# Patient Record
Sex: Male | Born: 1955 | Race: White | Hispanic: No | Marital: Married | Smoking: Former smoker
Health system: Southern US, Community
[De-identification: ages and names within clinical notes are randomized; demographics above are authoritative.]

## PROBLEM LIST (undated history)

## (undated) DIAGNOSIS — K642 Third degree hemorrhoids: Secondary | ICD-10-CM

## (undated) DIAGNOSIS — K648 Other hemorrhoids: Secondary | ICD-10-CM

## (undated) HISTORY — PX: COLONOSCOPY: SHX174

## (undated) HISTORY — DX: Other hemorrhoids: K64.8

## (undated) HISTORY — PX: TONSILLECTOMY: SUR1361

## (undated) HISTORY — DX: Third degree hemorrhoids: K64.2

## (undated) HISTORY — PX: INGUINAL HERNIA REPAIR: SUR1180

---

## 2005-06-08 ENCOUNTER — Inpatient Hospital Stay: Payer: Self-pay | Admitting: Podiatry

## 2006-05-17 IMAGING — CR RIGHT FOOT COMPLETE - 3+ VIEW
1 series · 3 of 3 positions shown · non-contrast
Comparison: none

REASON FOR EXAM: evaluation of osteomyelitis in the bone
COMMENTS:

[Series 1: view not recorded · 0.17mm/px · 3 of 3 slices shown]
[im 1/3]
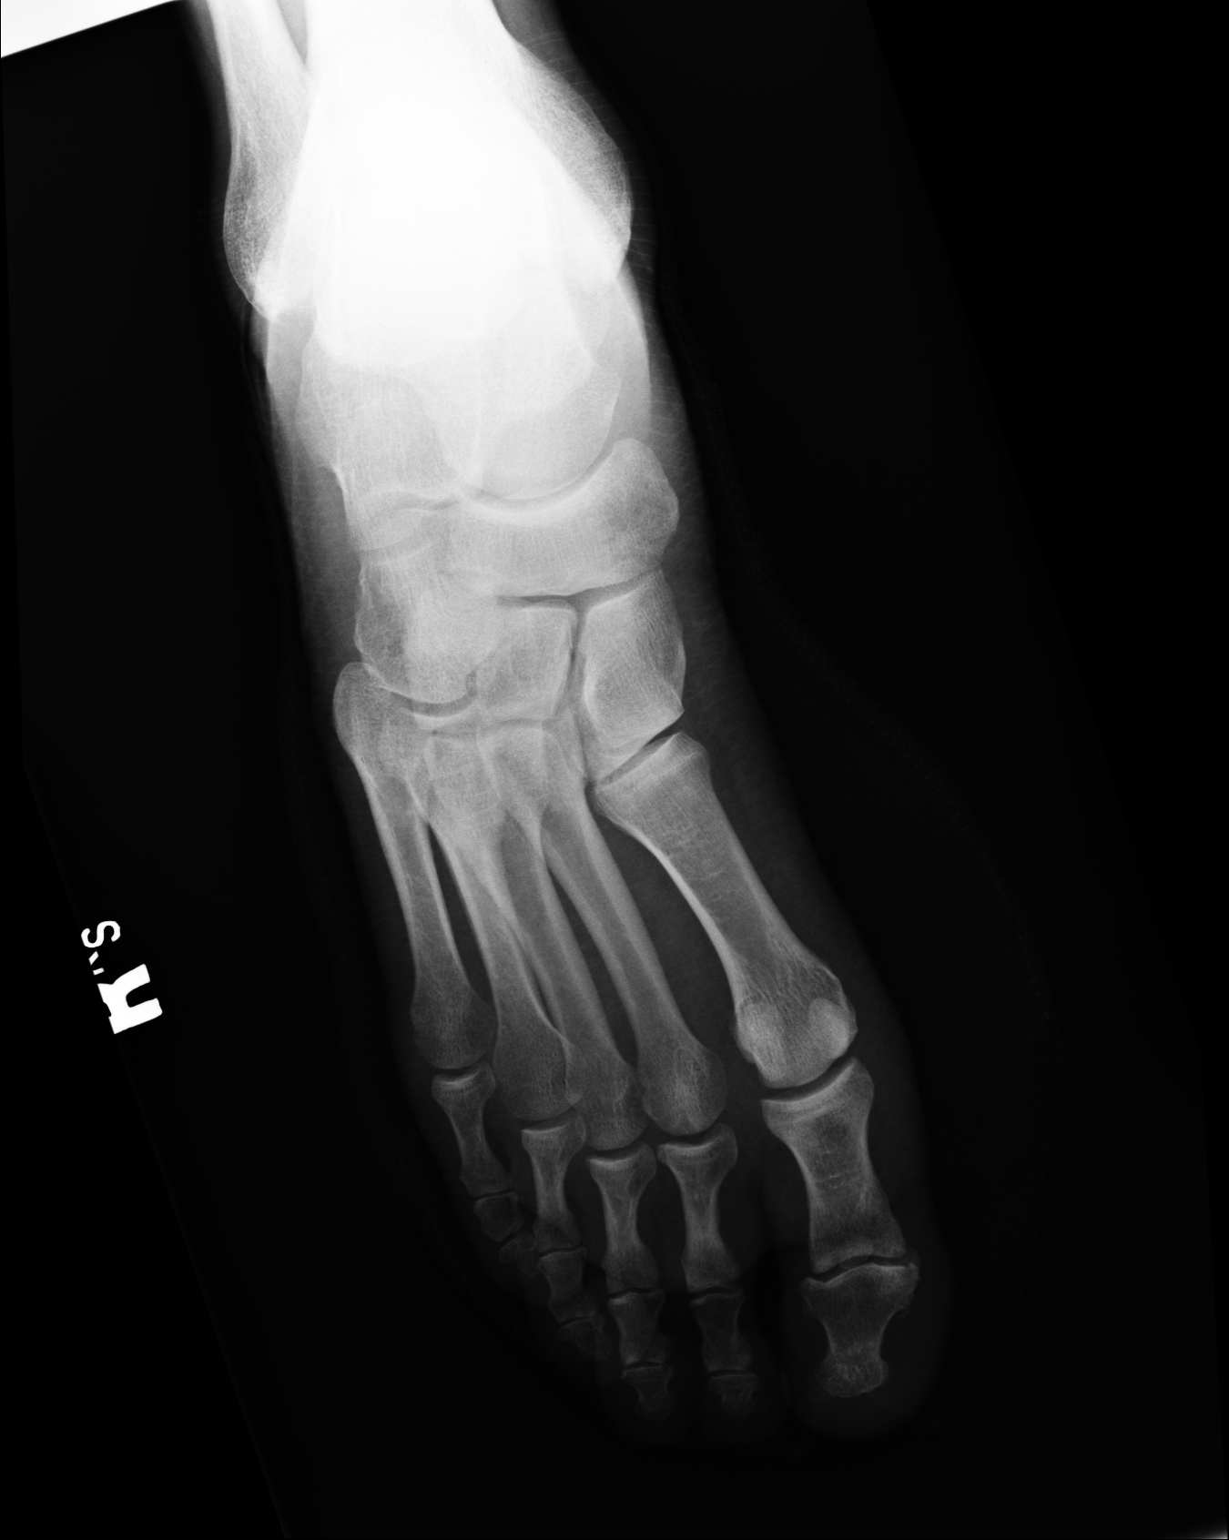
[im 2/3]
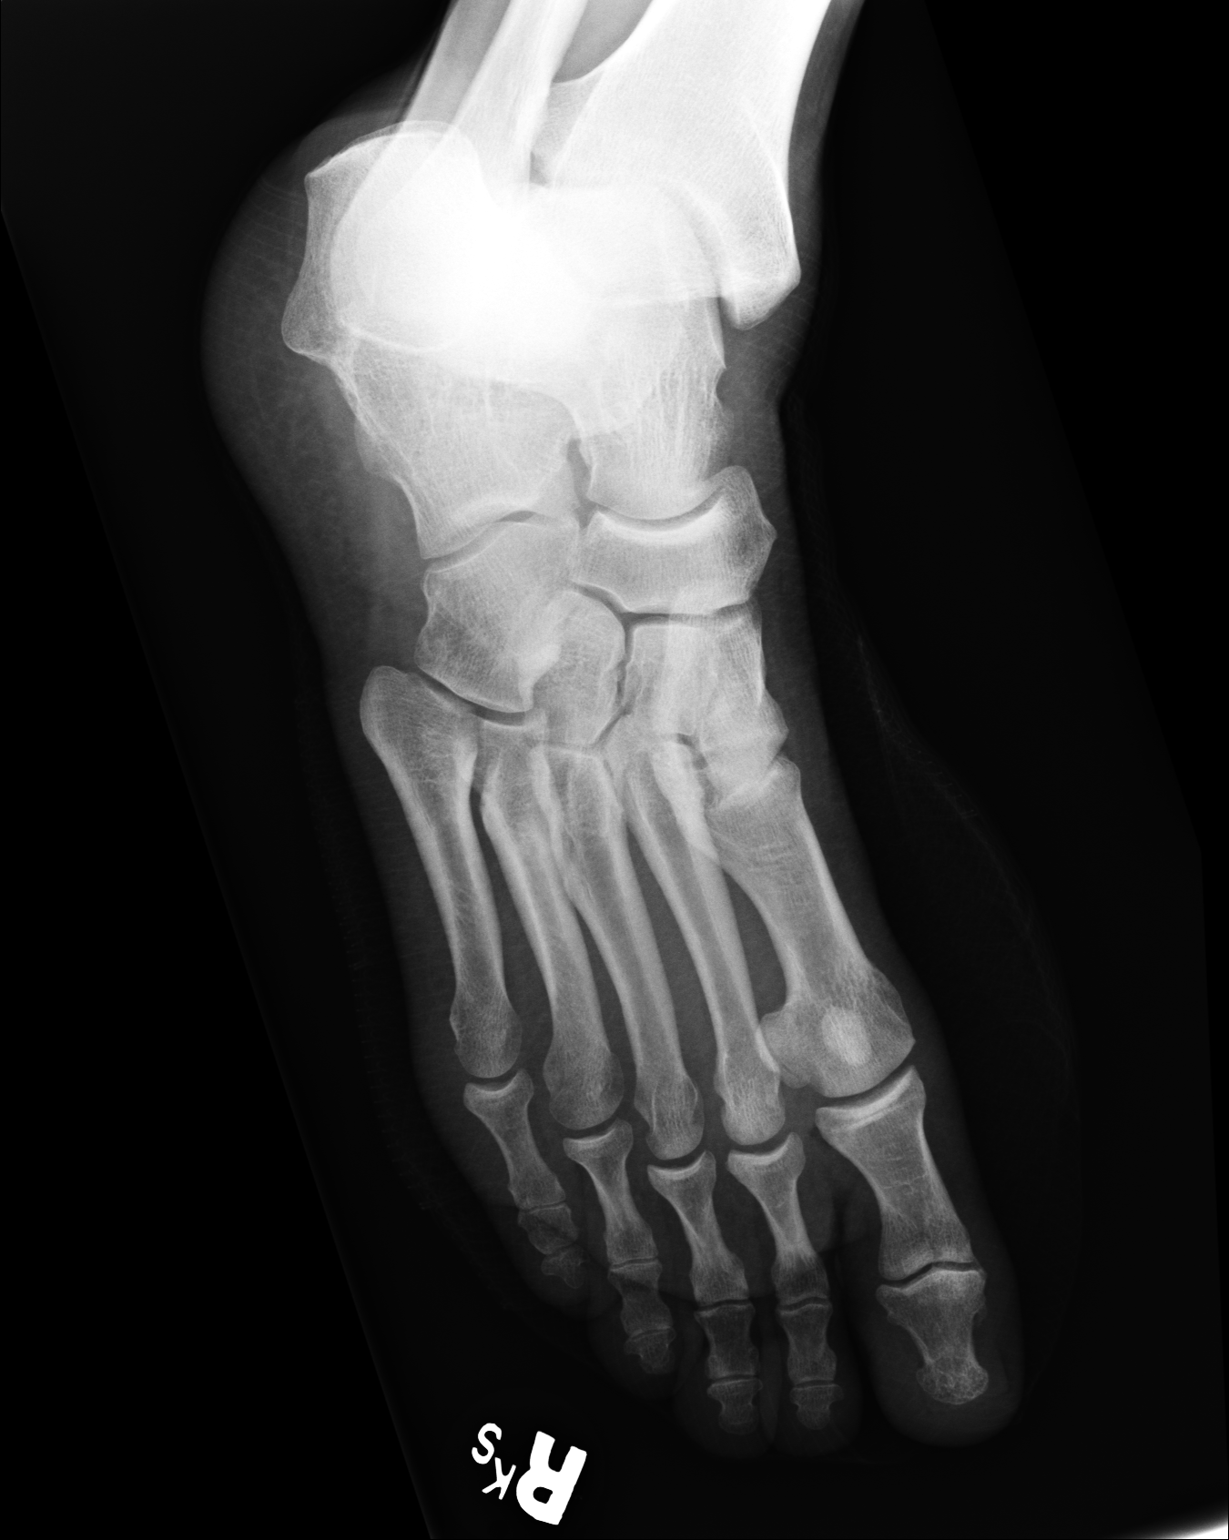
[im 3/3]
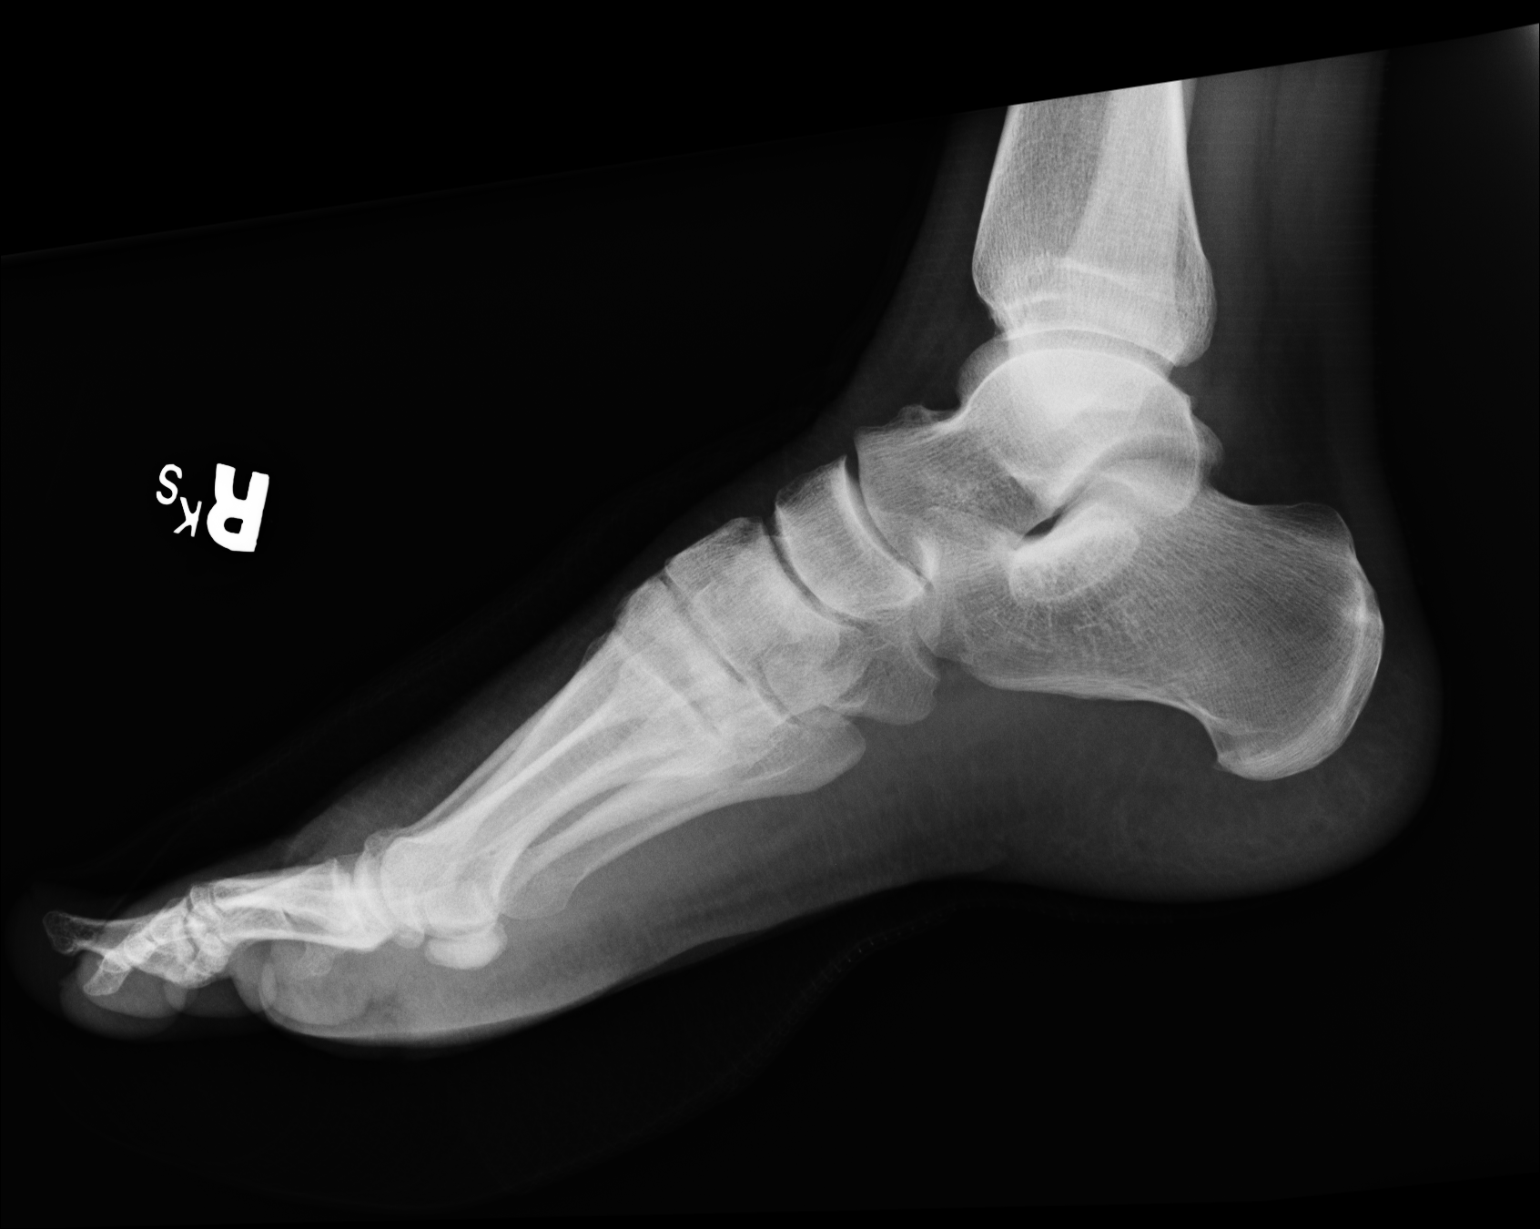

[3 of 3 positions shown; findings below may reference images not displayed]

PROCEDURE:     DXR - DXR FOOT RT COMPLETE W/OBLIQUES  - June 12, 2005  [DATE]

RESULT:     Three views of the RIGHT foot are obtained. There appears to be
some irregularity over the soft tissues of the ball of the foot on the
lateral view.  A bandage is present on the dorsoplantar view medially.
There does not appear to be evidence of bony resorption or rarefaction.  No
definite fracture is seen. No radiopaque foreign body is evident.
IMPRESSION:

## 2006-12-19 HISTORY — PX: OTHER SURGICAL HISTORY: SHX169

## 2007-03-18 ENCOUNTER — Ambulatory Visit: Payer: Self-pay | Admitting: Otolaryngology

## 2007-03-21 ENCOUNTER — Ambulatory Visit: Payer: Self-pay | Admitting: Otolaryngology

## 2007-04-17 ENCOUNTER — Ambulatory Visit: Payer: Self-pay | Admitting: Otolaryngology

## 2008-12-08 ENCOUNTER — Ambulatory Visit: Payer: Self-pay | Admitting: Gastroenterology

## 2013-07-12 ENCOUNTER — Ambulatory Visit: Payer: Self-pay | Admitting: Otolaryngology

## 2014-06-16 IMAGING — CT CT NECK WITH CONTRAST
2 series · 10 of 14 positions shown, 12 images · IV contrast (agent unspecified)
Comparison: none

REASON FOR EXAM: neck mass
COMMENTS:

PROCEDURE:     KCT - KCT NECK WITH CONTRAST  - July 12, 2013 [DATE]
RESULT:
TECHNIQUE: Helical 3 mm sections were obtained from the skull base through
the thoracic inlet status post intravenous administration of 75 mL of
Ysovue-D84.

[Series 2: neck 3.0 3 · axial · 0.46mm/px · z∈[-280,-13]mm · 8 of 115 slices shown, 10 images]
[im 13/115  soft-tissue]
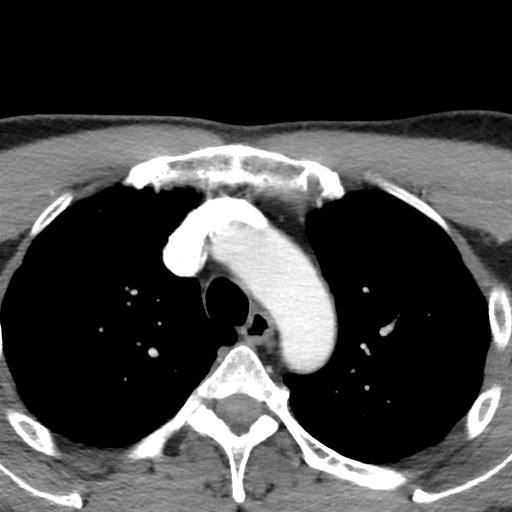
[im 13/115  bone]
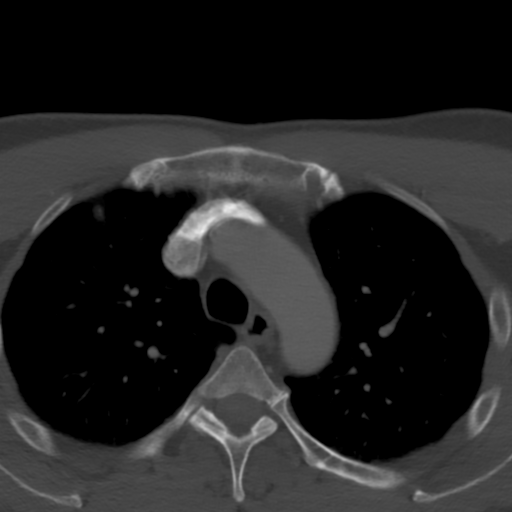
[im 26/115  bone]
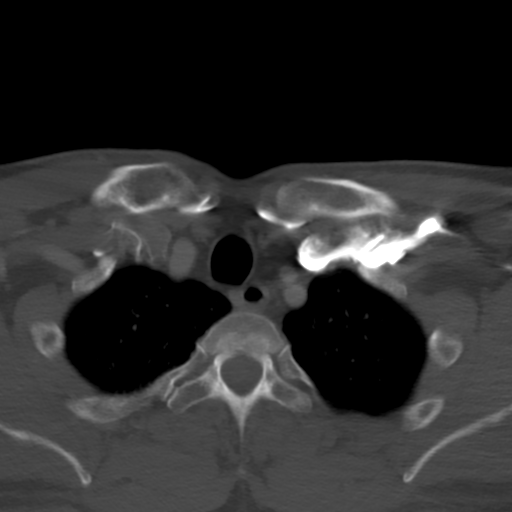
[im 39/115  bone]
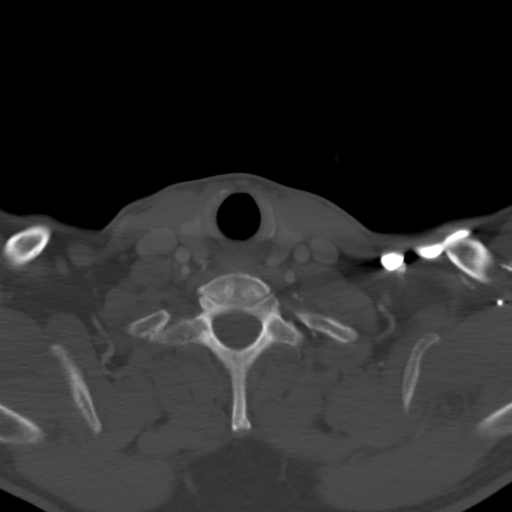
[im 51/115  bone]
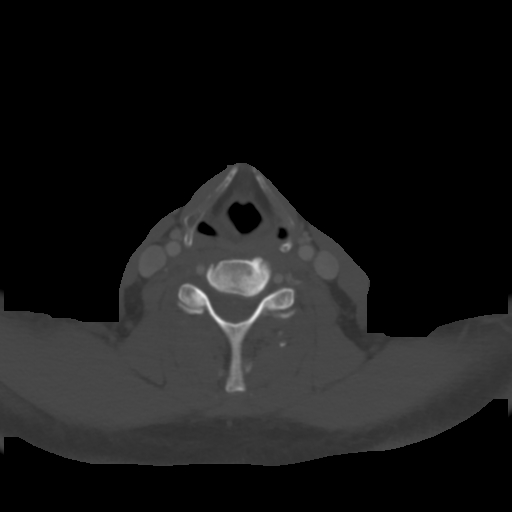
[im 64/115  soft-tissue]
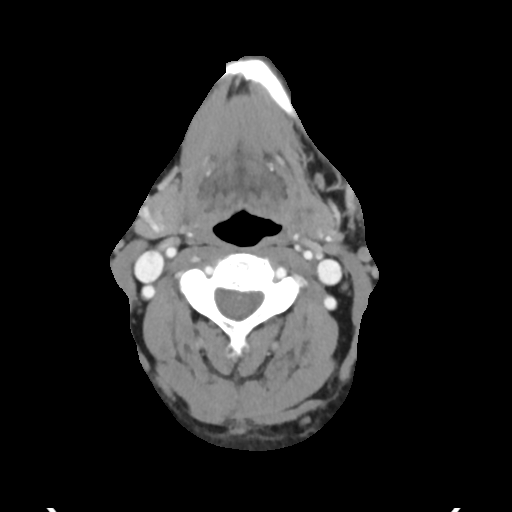
[im 64/115  bone]
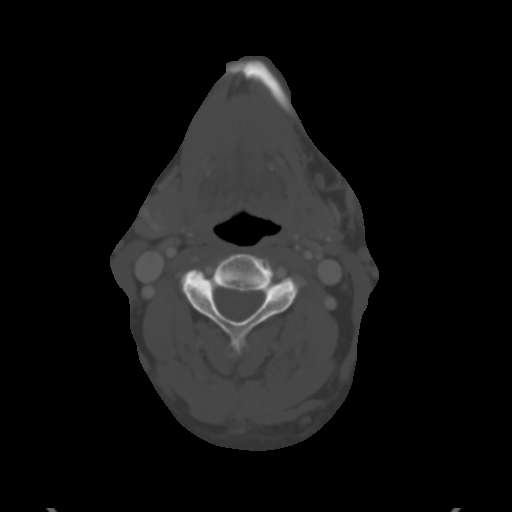
[im 77/115  bone]
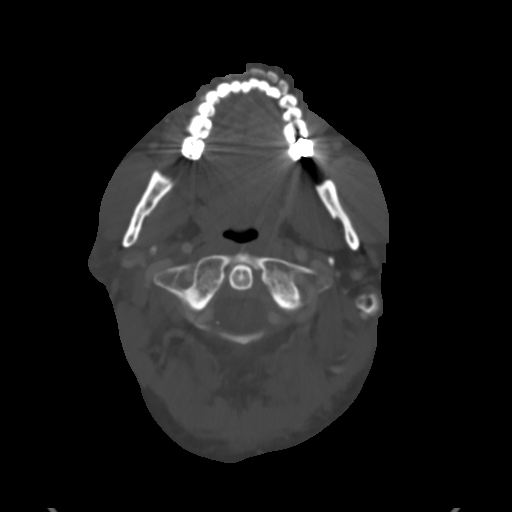
[im 89/115  bone]
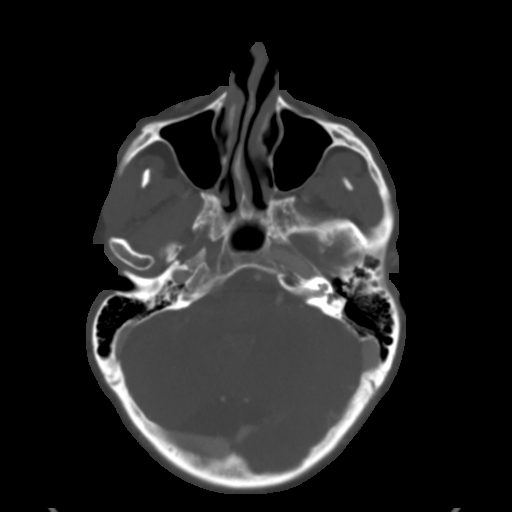
[im 102/115  bone]
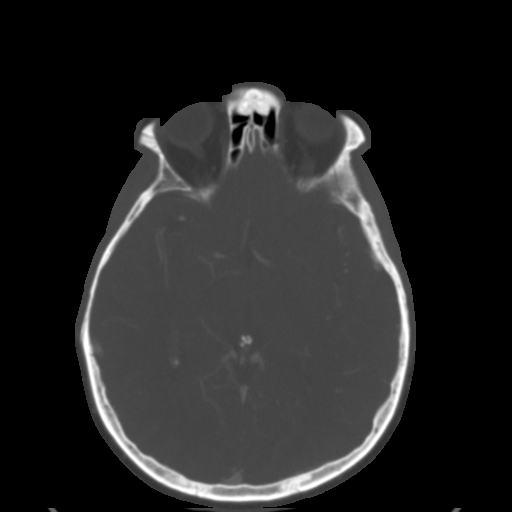

[Series 4: lung · axial · 0.62mm/px · z∈[-274,-232]mm · 2 of 44 slices shown]
[im 15/44  bone]
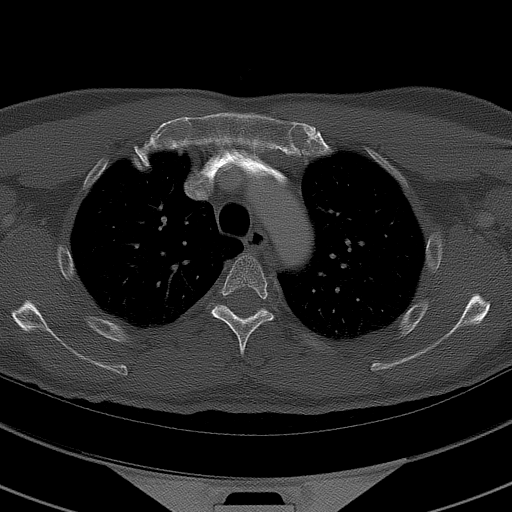
[im 29/44  bone]
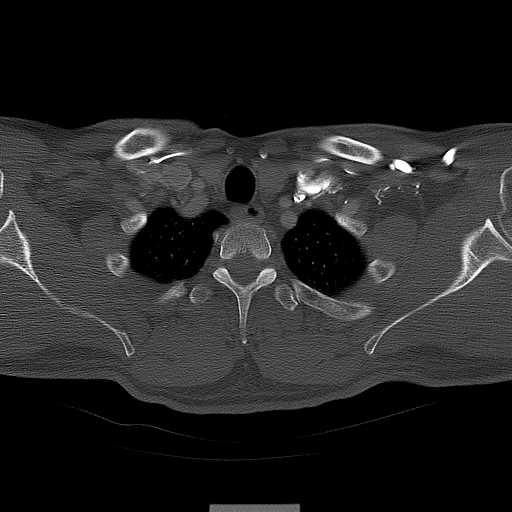

[10 of 14 positions shown; findings below may reference images not displayed]

FINDINGS: The skull base is unremarkable.

The pharyngeal and glottic regions are unremarkable. The airway is patent.
The spaces of the neck appear maintained. The opacified vascular structures
are unremarkable. Prominent lymph nodes are identified within the carotid
space region on the right the largest measuring 9.2 millimeters in diameter
in short axis and on the left 8.2 mm. There is otherwise no evidence of
masses, free fluid or loculated fluid collections. In the region of concern
along the anterior neck on the right a small subcutaneous lymph node is
appreciated measuring approximately 6.3 mm in diameter. Subcutaneous fat is
otherwise unremarkable. No further masses or adenopathy is identified.

The salivary glands are symmetric. The left parotid gland is not clearly
identified consistent with the patient's history of resection. The right
demonstrates no attenuation nor enhancement abnormalities. The musculature
of the neck is symmetric without abnormalities. The lung apices demonstrate
subpleural bullous changes posteriorly.
IMPRESSION: 1. No definite evidence of appreciable mass within the neck in the region of
palpable concern. A small lymph node is identified.
2. Prominent lymph nodes within the carotid space.
3. Postsurgical changes in the parotid bed on the left.

## 2017-10-09 ENCOUNTER — Observation Stay
Admission: EM | Admit: 2017-10-09 | Discharge: 2017-10-09 | Disposition: A | Discharge status: Home / Self Care | Payer: 59 | Attending: General Surgery | Admitting: General Surgery

## 2017-10-09 ENCOUNTER — Encounter: Payer: Self-pay | Admitting: *Deleted

## 2017-10-09 DIAGNOSIS — K644 Residual hemorrhoidal skin tags: Secondary | ICD-10-CM | POA: Diagnosis not present

## 2017-10-09 DIAGNOSIS — K642 Third degree hemorrhoids: Secondary | ICD-10-CM

## 2017-10-09 DIAGNOSIS — Z880 Allergy status to penicillin: Secondary | ICD-10-CM | POA: Insufficient documentation

## 2017-10-09 DIAGNOSIS — K648 Other hemorrhoids: Secondary | ICD-10-CM | POA: Diagnosis not present

## 2017-10-09 DIAGNOSIS — Z79899 Other long term (current) drug therapy: Secondary | ICD-10-CM | POA: Insufficient documentation

## 2017-10-09 HISTORY — DX: Third degree hemorrhoids: K64.2

## 2017-10-09 LAB — CBC WITH DIFFERENTIAL/PLATELET
Basophils Absolute: 0.1 10*3/uL (ref 0–0.1)
Basophils Relative: 1 %
Eosinophils Absolute: 0.1 10*3/uL (ref 0–0.7)
Eosinophils Relative: 1 %
HEMATOCRIT: 48.3 % (ref 40.0–52.0)
HEMOGLOBIN: 16.5 g/dL (ref 13.0–18.0)
LYMPHS ABS: 1.6 10*3/uL (ref 1.0–3.6)
LYMPHS PCT: 14 %
MCH: 30.2 pg (ref 26.0–34.0)
MCHC: 34.1 g/dL (ref 32.0–36.0)
MCV: 88.5 fL (ref 80.0–100.0)
MONOS PCT: 8 %
Monocytes Absolute: 0.9 10*3/uL (ref 0.2–1.0)
NEUTROS ABS: 8.5 10*3/uL — AB (ref 1.4–6.5)
NEUTROS PCT: 76 %
Platelets: 207 10*3/uL (ref 150–440)
RBC: 5.46 MIL/uL (ref 4.40–5.90)
RDW: 13.1 % (ref 11.5–14.5)
WBC: 11.1 10*3/uL — ABNORMAL HIGH (ref 3.8–10.6)

## 2017-10-09 LAB — URINALYSIS, ROUTINE W REFLEX MICROSCOPIC
Bilirubin Urine: NEGATIVE
Glucose, UA: NEGATIVE mg/dL
HGB URINE DIPSTICK: NEGATIVE
Ketones, ur: 5 mg/dL — AB
Leukocytes, UA: NEGATIVE
Nitrite: NEGATIVE
PH: 5 (ref 5.0–8.0)
Protein, ur: NEGATIVE mg/dL
SPECIFIC GRAVITY, URINE: 1.027 (ref 1.005–1.030)

## 2017-10-09 LAB — COMPREHENSIVE METABOLIC PANEL
ALBUMIN: 4.4 g/dL (ref 3.5–5.0)
ALK PHOS: 71 U/L (ref 38–126)
ALT: 20 U/L (ref 17–63)
ANION GAP: 8 (ref 5–15)
AST: 30 U/L (ref 15–41)
BUN: 29 mg/dL — ABNORMAL HIGH (ref 6–20)
CALCIUM: 9.6 mg/dL (ref 8.9–10.3)
CO2: 24 mmol/L (ref 22–32)
Chloride: 103 mmol/L (ref 101–111)
Creatinine, Ser: 0.92 mg/dL (ref 0.61–1.24)
GFR calc Af Amer: >60 mL/min (ref >60)
GFR calc non Af Amer: >60 mL/min (ref >60)
GLUCOSE: 116 mg/dL — AB (ref 65–99)
Potassium: 4 mmol/L (ref 3.5–5.1)
SODIUM: 135 mmol/L (ref 135–145)
Total Bilirubin: 0.6 mg/dL (ref 0.3–1.2)
Total Protein: 7.7 g/dL (ref 6.5–8.1)

## 2017-10-09 LAB — LIPASE, BLOOD: Lipase: 32 U/L (ref 11–51)

## 2017-10-09 MED ORDER — OXYCODONE-ACETAMINOPHEN 5-325 MG PO TABS: 1.0000 | ORAL_TABLET | Freq: Four times a day (QID) | ORAL | Status: DC | PRN

## 2017-10-09 MED ORDER — ACETAMINOPHEN 650 MG RE SUPP: 650.0000 mg | Freq: Four times a day (QID) | RECTAL | Status: DC | PRN

## 2017-10-09 MED ORDER — DOCUSATE SODIUM 100 MG PO CAPS: 100.0000 mg | ORAL_CAPSULE | Freq: Every day | ORAL | 0 refills | Status: AC | PRN

## 2017-10-09 MED ORDER — HYDROCORTISONE 2.5 % RE CREA: TOPICAL_CREAM | Freq: Three times a day (TID) | RECTAL | 0 refills | Status: DC

## 2017-10-09 MED ORDER — OXYCODONE HCL 5 MG PO TABS: 5.0000 mg | ORAL_TABLET | ORAL | Status: DC | PRN

## 2017-10-09 MED ORDER — WITCH HAZEL-GLYCERIN EX PADS
MEDICATED_PAD | CUTANEOUS | Status: DC | PRN
  Filled 2017-10-09: qty 100

## 2017-10-09 MED ORDER — LACTATED RINGERS IV SOLN
INTRAVENOUS | Status: DC
  Administered 2017-10-09: 12:00:00 via INTRAVENOUS

## 2017-10-09 MED ORDER — KETOROLAC TROMETHAMINE 30 MG/ML IJ SOLN
30.0000 mg | Freq: Four times a day (QID) | INTRAMUSCULAR | Status: DC
  Administered 2017-10-09 (×2): 30 mg via INTRAVENOUS
  Filled 2017-10-09 (×2): qty 1

## 2017-10-09 MED ORDER — HYDROCORTISONE 2.5 % RE CREA
TOPICAL_CREAM | Freq: Three times a day (TID) | RECTAL | Status: DC
  Administered 2017-10-09 (×3): via RECTAL
  Filled 2017-10-09: qty 28.35

## 2017-10-09 MED ORDER — WITCH HAZEL-GLYCERIN EX PADS
MEDICATED_PAD | Freq: Three times a day (TID) | CUTANEOUS | Status: DC
  Administered 2017-10-09 (×2): via TOPICAL
  Filled 2017-10-09: qty 100

## 2017-10-09 MED ORDER — ONDANSETRON HCL 4 MG/2ML IJ SOLN
4.0000 mg | Freq: Once | INTRAMUSCULAR | Status: AC
  Administered 2017-10-09: 4 mg via INTRAVENOUS
  Filled 2017-10-09: qty 2

## 2017-10-09 MED ORDER — DEXTROSE-NACL 5-0.45 % IV SOLN
INTRAVENOUS | Status: DC
  Administered 2017-10-09: 06:00:00 via INTRAVENOUS

## 2017-10-09 MED ORDER — LIDOCAINE 5 % EX OINT: 1.0000 "application " | TOPICAL_OINTMENT | Freq: Three times a day (TID) | CUTANEOUS | 0 refills | Status: DC | PRN

## 2017-10-09 MED ORDER — MORPHINE SULFATE (PF) 2 MG/ML IV SOLN
2.0000 mg | INTRAVENOUS | Status: DC | PRN
Start: 2017-10-09 — End: 2017-10-09

## 2017-10-09 MED ORDER — ONDANSETRON 4 MG PO TBDP: 4.0000 mg | ORAL_TABLET | Freq: Four times a day (QID) | ORAL | Status: DC | PRN

## 2017-10-09 MED ORDER — MORPHINE SULFATE (PF) 2 MG/ML IV SOLN
2.0000 mg | Freq: Once | INTRAVENOUS | Status: AC
  Administered 2017-10-09: 2 mg via INTRAVENOUS
  Filled 2017-10-09: qty 1

## 2017-10-09 MED ORDER — WITCH HAZEL-GLYCERIN EX PADS: MEDICATED_PAD | Freq: Three times a day (TID) | CUTANEOUS | 2 refills | Status: DC

## 2017-10-09 MED ORDER — LIDOCAINE-EPINEPHRINE-TETRACAINE (LET) SOLUTION
3.0000 mL | Freq: Once | NASAL | Status: AC
  Administered 2017-10-09: 3 mL via TOPICAL
  Filled 2017-10-09: qty 3

## 2017-10-09 MED ORDER — ACETAMINOPHEN 325 MG PO TABS: 650.0000 mg | ORAL_TABLET | Freq: Four times a day (QID) | ORAL | Status: DC | PRN

## 2017-10-09 MED ORDER — ONDANSETRON HCL 4 MG/2ML IJ SOLN: 4.0000 mg | Freq: Four times a day (QID) | INTRAMUSCULAR | Status: DC | PRN

## 2017-10-09 NOTE — H&P (Signed)
Leroy Matthews is an 61 y.o. male.   Chief Complaint: painful hemorrhoid, unable to void for last 10-12hrs.  HPI: 61 year old previously healthy male presented to the ER with a complaint of severe pain associated with a hemorrhoid. He had been doing some heavy lifting early in the afternoon yesterday around the middle of the day and about 3 to 4:00 developed severe pain and swelling in the hemorrhoid. He has had hemorrhoid symptoms in the past usually relatively minor and responding to Preparation H. This time the pain and swelling were severe in addition he was unable to urinate and therefore presented to the emergency room. He had a normal bowel movement yesterday and had a small loose one today. No other GI symptoms. In the ER the patient was evaluated and surgical input was requested and a Foley catheter was inserted.   No past medical history on file.  No past surgical history on file.  No family history on file. Social History:  has no tobacco, alcohol, and drug history on file.  Allergies:  Allergies  Allergen Reactions  . Penicillins Hives  . Sulfa Antibiotics Hives     (Not in a hospital admission)  Results for orders placed or performed during the hospital encounter of 10/09/17 (from the past 48 hour(s))  Comprehensive metabolic panel     Status: Abnormal   Collection Time: 10/09/17  1:58 AM  Result Value Ref Range   Sodium 135 135 - 145 mmol/L   Potassium 4.0 3.5 - 5.1 mmol/L   Chloride 103 101 - 111 mmol/L   CO2 24 22 - 32 mmol/L   Glucose, Bld 116 (H) 65 - 99 mg/dL   BUN 29 (H) 6 - 20 mg/dL   Creatinine, Ser 0.92 0.61 - 1.24 mg/dL   Calcium 9.6 8.9 - 10.3 mg/dL   Total Protein 7.7 6.5 - 8.1 g/dL   Albumin 4.4 3.5 - 5.0 g/dL   AST 30 15 - 41 U/L   ALT 20 17 - 63 U/L   Alkaline Phosphatase 71 38 - 126 U/L   Total Bilirubin 0.6 0.3 - 1.2 mg/dL   GFR calc non Af Amer >60 >60 mL/min   GFR calc Af Amer >60 >60 mL/min    Comment: (NOTE) The eGFR has been calculated  using the CKD EPI equation. This calculation has not been validated in all clinical situations. eGFR's persistently <60 mL/min signify possible Chronic Kidney Disease.    Anion gap 8 5 - 15  Lipase, blood     Status: None   Collection Time: 10/09/17  1:58 AM  Result Value Ref Range   Lipase 32 11 - 51 U/L  CBC with Diff     Status: Abnormal   Collection Time: 10/09/17  1:58 AM  Result Value Ref Range   WBC 11.1 (H) 3.8 - 10.6 K/uL   RBC 5.46 4.40 - 5.90 MIL/uL   Hemoglobin 16.5 13.0 - 18.0 g/dL   HCT 48.3 40.0 - 52.0 %   MCV 88.5 80.0 - 100.0 fL   MCH 30.2 26.0 - 34.0 pg   MCHC 34.1 32.0 - 36.0 g/dL   RDW 13.1 11.5 - 14.5 %   Platelets 207 150 - 440 K/uL   Neutrophils Relative % 76 %   Neutro Abs 8.5 (H) 1.4 - 6.5 K/uL   Lymphocytes Relative 14 %   Lymphs Abs 1.6 1.0 - 3.6 K/uL   Monocytes Relative 8 %   Monocytes Absolute 0.9 0.2 - 1.0 K/uL  Eosinophils Relative 1 %   Eosinophils Absolute 0.1 0 - 0.7 K/uL   Basophils Relative 1 %   Basophils Absolute 0.1 0 - 0.1 K/uL  Urinalysis, Routine w reflex microscopic     Status: Abnormal   Collection Time: 10/09/17  1:59 AM  Result Value Ref Range   Color, Urine YELLOW (A) YELLOW   APPearance HAZY (A) CLEAR   Specific Gravity, Urine 1.027 1.005 - 1.030   pH 5.0 5.0 - 8.0   Glucose, UA NEGATIVE NEGATIVE mg/dL   Hgb urine dipstick NEGATIVE NEGATIVE   Bilirubin Urine NEGATIVE NEGATIVE   Ketones, ur 5 (A) NEGATIVE mg/dL   Protein, ur NEGATIVE NEGATIVE mg/dL   Nitrite NEGATIVE NEGATIVE   Leukocytes, UA NEGATIVE NEGATIVE   No results found.  Review of Systems  Respiratory: Negative.   Cardiovascular: Negative.   Gastrointestinal: Positive for constipation. Negative for abdominal pain, diarrhea, nausea and vomiting.       Swollen painful hemorrhoid.  Genitourinary: Negative for dysuria, frequency and urgency.       Unable to urinate for last 10-12 hrs    Blood pressure 140/85, pulse 93, temperature 98.8 F (37.1 C),  temperature source Oral, resp. rate 16, height 6' (1.829 m), weight 195 lb (88.5 kg), SpO2 98 %. Physical Exam  Constitutional: He is oriented to person, place, and time. He appears well-developed and well-nourished.  Eyes: Conjunctivae are normal. No scleral icterus.  Neck: Neck supple.  Cardiovascular: Normal rate, regular rhythm and normal heart sounds.   Respiratory: Effort normal and breath sounds normal.  GI: Soft. Bowel sounds are normal. He exhibits no distension. There is no tenderness.  Genitourinary: Rectal exam shows external hemorrhoid and internal hemorrhoid.     Neurological: He is alert and oriented to person, place, and time.  Skin: Skin is warm and dry.     Assessment/Plan Acutely inflamed prolapsing internal and external hemorrhoid with the significant pain and associated urinary retention because of it. Given with the patient is extremely uncomfortable elected to observe with the local preparations for hemorrhoid and bedrest and reassess later today for progress or possible need for surgery. Urinary retention is likely secondary to his hemorrhoid but will require at least a urology evaluation   Patient is agreeable with this plan  Christene Lye, MD 10/09/2017, 4:22 AM

## 2017-10-09 NOTE — ED Notes (Signed)
Pt aware of need to collect stool

## 2017-10-09 NOTE — ED Triage Notes (Signed)
Pt reports last BM this afternoon that was loose, pt states that he had a normal BM yesterday, pt reports pain in his rectum, hx of hemhroids, pt reports being back from [place] for a week states that he completed an antimalarial Wed, also states that he is having urinary retention now

## 2017-10-09 NOTE — ED Notes (Signed)
surgery Provider at bedside.

## 2017-10-09 NOTE — ED Provider Notes (Signed)
[facility] Emergency Department Provider Note    First MD Initiated Contact with Patient 10/09/17 [phone]     (approximate)  I have reviewed the triage vital signs and the nursing notes.   HISTORY  Chief Complaint Constipation and Urinary Retention   HPI Leroy Matthews is a 61 y.o. male Presents emergency department with 10 out of 10 rectal pain. Patient states that he had 2 bowel movements yesterday one of which was normal and the subsequent was loose but not diarrhea. Patient admits to recent travel with returning from [place] one week ago. Patient denies any fever no nausea or vomiting. Patient does admit to abdominal discomfort as well as inability to urinate despite urgency.   No past medical history on file.  Patient Active Problem List   Diagnosis Date Noted  . Hemorrhoid prolapse 10/09/2017    past surgical history Noncontributory  Prior to Admission medications   Not on File    Allergies Penicillins and Sulfa antibiotics  No family history on file.  Social History Social History  Substance Use Topics  . Smoking status: Not on file  . Smokeless tobacco: Not on file  . Alcohol use Not on file    Review of Systems Constitutional: No fever/chills Eyes: No visual changes. ENT: No sore throat. Cardiovascular: Denies chest pain. Respiratory: Denies shortness of breath. Gastrointestinal: No abdominal pain.  No nausea, no vomiting.  No diarrhea.  No constipation.positive for rectal pain Genitourinary: Negative for dysuria. Musculoskeletal: Negative for neck pain.  Negative for back pain. Integumentary: Negative for rash. Neurological: Negative for headaches, focal weakness or numbness.   ____________________________________________   PHYSICAL EXAM:  VITAL SIGNS: ED Triage Vitals  Enc Vitals Group     BP 10/09/17 0154 140/85     Pulse Rate 10/09/17 0154 93     Resp 10/09/17 0154 16     Temp 10/09/17 0154 98.8 F (37.1 C)       Temp Source 10/09/17 0154 Oral     SpO2 10/09/17 0154 98 %     Weight 10/09/17 0155 88.5 kg (195 lb)     Height 10/09/17 0155 1.829 m (6')     Head Circumference --      Peak Flow --      Pain Score 10/09/17 0153 9     Pain Loc --      Pain Edu? --      Excl. in GC? --     Constitutional: Alert and oriented. apparent discomfort Eyes: Conjunctivae are normal. Head: Atraumatic. Mouth/Throat: Mucous membranes are moist.  Oropharynx non-erythematous. Neck: No stridor. Cardiovascular: Normal rate, regular rhythm. Good peripheral circulation. Grossly normal heart sounds. Respiratory: Normal respiratory effort.  No retractions. Lungs CTAB. Gastrointestinal: Soft and nontender. No distention. rectal exam revealed inflamed prolapsewith areas concerning for thrombosis (overlying mucosal changes ecchymoses) and evidence of recent bleeding Musculoskeletal: No lower extremity tenderness nor edema. No gross deformities of extremities. Neurologic:  Normal speech and language. No gross focal neurologic deficits are appreciated.  Skin:  Skin is warm, dry and intact. No rash noted. Psychiatric: Mood and affect are normal. Speech and behavior are normal.  ____________________________________________   LABS (all labs ordered are listed, but only abnormal results are displayed)  Labs Reviewed  COMPREHENSIVE METABOLIC PANEL - Abnormal; Notable for the following:       Result Value   Glucose, Bld 116 (*)    BUN 29 (*)    All other components within normal limits  CBC WITH DIFFERENTIAL/PLATELET - Abnormal; Notable for the following:    WBC 11.1 (*)    Neutro Abs 8.5 (*)    All other components within normal limits  URINALYSIS, ROUTINE W REFLEX MICROSCOPIC - Abnormal; Notable for the following:    Color, Urine YELLOW (*)    APPearance HAZY (*)    Ketones, ur 5 (*)    All other components within normal limits  LIPASE, BLOOD  HIV ANTIBODY (ROUTINE TESTING)      Procedures   ____________________________________________   INITIAL IMPRESSION / ASSESSMENT AND PLAN / ED COURSE  As part of my medical decision making, I reviewed the following data within the electronic MEDICAL RECORD NUMBER61 year old male presented with history of physical exam concerning for inflamed prolapsed hemorrhoid versus thrombosed hemorrhoid.topical anesthetic applied to the patient's hemorrhoid as well asIV morphine 2 mg with improvement of pain.patient discussed with Dr.Sankar who evaluated the patient emergency department concluded that the patient had a prolapse inflamed internal hemorrhoid with no evidence of thrombosis. Dr. Evette Cristal are decided to admit the patient for pain control and further observation.   ____________________________________________  FINAL CLINICAL IMPRESSION(S) / ED DIAGNOSES  Final diagnoses:  Inflamed internal hemorrhoid     MEDICATIONS GIVEN DURING THIS VISIT:  Medications  dextrose 5 %-0.45 % sodium chloride infusion (not administered)  acetaminophen (TYLENOL) tablet 650 mg (not administered)    Or  acetaminophen (TYLENOL) suppository 650 mg (not administered)  oxyCODONE (Oxy IR/ROXICODONE) immediate release tablet 5-10 mg (not administered)  morphine 2 MG/ML injection 2 mg (not administered)  witch hazel-glycerin (TUCKS) pad (not administered)  hydrocortisone (ANUSOL-HC) 2.5 % rectal cream (not administered)  ondansetron (ZOFRAN-ODT) disintegrating tablet 4 mg (not administered)    Or  ondansetron (ZOFRAN) injection 4 mg (not administered)  morphine 2 MG/ML injection 2 mg (2 mg Intravenous Given 10/09/17 0336)  ondansetron (ZOFRAN) injection 4 mg (4 mg Intravenous Given 10/09/17 0336)  lidocaine-EPINEPHrine-tetracaine (LET) solution (3 mLs Topical Given 10/09/17 0336)     NEW OUTPATIENT MEDICATIONS STARTED DURING THIS VISIT:  New Prescriptions   No medications on file    Modified Medications   No medications on file     Discontinued Medications   No medications on file     Note:  This document was prepared using Dragon voice recognition software and may include unintentional dictation errors.    Darci Current, MD 10/10/17 Jeralyn Bennett

## 2017-10-09 NOTE — Discharge Instructions (Addendum)
In addition to included general instructions for non-thrombosed hemorrhoids,  Diet: Resume home heart healthy high fiber diet.   Medications: Resume all home medications AND topical medications prescribed to reduce hemorrhoid swelling (along with ice to hemorrhoids) and for symptomatic relief. For mild to moderate pain: acetaminophen (Tylenol) or ibuprofen (if no kidney disease). Combining Tylenol with alcohol can substantially increase your risk of causing liver disease. Narcotic pain medications, if prescribed, can be used for severe pain, though may cause nausea, constipation, and drowsiness. Do not combine Tylenol and Percocet within a 6 hour period as Percocet contains Tylenol. If you do not need the narcotic pain medication, you do not need to fill the prescription.  Call office [phone]) at any time if any questions, worsening pain, fevers/chills, bleeding, drainage from incision site, or other concerns.

## 2017-10-09 NOTE — Progress Notes (Signed)
Patient cleared for discharge by MD Earlene Plateravis. Instructions given. Prescriptions given. IV removed. Patient discharged to home with wife.

## 2017-10-09 NOTE — Progress Notes (Signed)
SURGICAL PROGRESS NOTE (cpt [phone])  Hospital Day(s): 0.   Post op day(s):  Marland Kitchen.   Interval History: Patient seen and examined, no acute events or new complaints since hospital admission overnight. Patient reports his anal discomfort has improved noticeably, and he expresses he is hungry for food, though he describes ongoing sensation of perianal "fullness" despite BM x 2 yesterday and one the day prior, denies fever, emesis, CP, or SOB. Of note, patient states he frequently experiences constipation upon returning from business travel to [place], such as that from which he returned 1 week ago today, and he suspects he was dehydrated while chopping wood in his backyard, which may have contributed to his not needing to urinate again, having urinated 1 hour prior to presentation with sensation of urinary urgency with anal "fullness" and pain.  Review of Systems:  Constitutional: denies fever, chills  HEENT: recurrent cough and congestion following travel to [place], including that from which he returned 1 week ago Respiratory: denies any shortness of breath  Cardiovascular: denies chest pain or palpitations  Gastrointestinal: abdominal pain, N/V, and bowel function as per interval history Genitourinary: denies burning with urination or urinary frequency Musculoskeletal: denies pain, decreased motor or sensation Integumentary: denies any other rashes or skin discolorations Neurological: denies HA or vision/hearing changes   Vital signs in last 24 hours: [min-max] current  Temp:  [98 F (36.7 C)-98.8 F (37.1 C)] 98.1 F (36.7 C) (10/22 0935) Pulse Rate:  [73-93] 73 (10/22 0935) Resp:  [14-18] 14 (10/22 0935) BP: (121-140)/(62-85) 121/76 (10/22 0935) SpO2:  [98 %-99 %] 98 % (10/22 0935) Weight:  [195 lb (88.5 kg)] 195 lb (88.5 kg) (10/22 0155)     Height: 6' (182.9 cm) Weight: 195 lb (88.5 kg) BMI (Calculated): 26.44   Intake/Output this shift:  Total I/O In: 101 [I.V.:101] Out: 350  [Urine:350]   Intake/Output last 2 shifts:  @IOLAST2SHIFTS @   Physical Exam:  Constitutional: alert, cooperative and no distress  HENT: normocephalic without obvious abnormality  Eyes: PERRL, EOM's grossly intact and symmetric  Neuro: CN II - XII grossly intact and symmetric without deficit  Respiratory: breathing non-labored at rest  Cardiovascular: regular rate and sinus rhythm  Gastrointestinal: soft, non-tender, and non-distended with moderately tender to palpation large and partially reducible prolapsed grade 3 non-thrombosed internal hemorrhoids, no blood per rectum Musculoskeletal: UE and LE FROM, no edema or wounds, motor and sensation grossly intact, NT   Labs:  CBC Latest Ref Rng & Units 10/09/2017  WBC 3.8 - 10.6 K/uL 11.1(H)  Hemoglobin 13.0 - 18.0 g/dL 60.416.5  Hematocrit 54.040.0 - 52.0 % 48.3  Platelets 150 - 440 K/uL 207   CMP Latest Ref Rng & Units 10/09/2017  Glucose 65 - 99 mg/dL 981(X116(H)  BUN 6 - 20 mg/dL 91(Y29(H)  Creatinine 7.820.61 - 1.24 mg/dL 9.560.92  Sodium 213135 - 086145 mmol/L 135  Potassium 3.5 - 5.1 mmol/L 4.0  Chloride 101 - 111 mmol/L 103  CO2 22 - 32 mmol/L 24  Calcium 8.9 - 10.3 mg/dL 9.6  Total Protein 6.5 - 8.1 g/dL 7.7  Total Bilirubin 0.3 - 1.2 mg/dL 0.6  Alkaline Phos 38 - 126 U/L 71  AST 15 - 41 U/L 30  ALT 17 - 63 U/L 20   Imaging studies: No new pertinent imaging studies   Assessment/Plan: (ICD-10's: 62K64.2) 61 y.o. male with grade 3 prolapsed non-thrombosed symptomatic internal hemorrhoids and indwelling Foley catheter inserted for urinary incontinence, complicated by pertinent comorbidities including chronic tobacco abuse and  intermittent constipation.   - will discontinue Foley catheter and monitor voiding  - continue ice and anal hydrocortisone (Anusol), Tucks, and topical anal lidocaine (Recticare)  - if able to void with improved symptoms, anticipate discharge home with outpatient surgical followup   - maintain hydration with high fiber diet to  minimize constipation  - medical management/home medications for comorbidities  - smoking cessation strongly advised, discussed  - DVT prophylaxis, ambulation encouraged  - pain control prn, minimize narcotics  All of the above findings and recommendations were discussed with the patient, and all of patient's questions were answered to his expressed satisfaction.  -- Scherrie Gerlach Earlene Plater, MD, RPVI [place]: [facility] General [facility] - Partnering for exceptional care. Office: [phone]

## 2017-10-10 NOTE — Discharge Summary (Signed)
Physician Discharge Summary  Patient ID: Leroy DonathDavid A Matthews MRN: [id number] DOB/AGE: 16-Feb-1956 61 y.o.  Admit date: 10/09/2017 Discharge date: 10/10/2017  Admission Diagnoses:  Discharge Diagnoses:  Active Problems:   Hemorrhoid prolapse   Discharged Condition: good  Hospital Course: 61 y.o. male presented to [facility] ED for peri-anal pain and swelling with fecal and urinary urgency with described inability to void or defecate despite sensation/urge to do both. Exam was found to be significant for large and medium sized tender and swollen non-thrombosed hemorrhoids. Foley catheter was inserted by ED physician with only 450 mL obtained. Urine output improved with hydration, and patient's pain and swelling improved/resolved with ice, topical steroid cream, and witch hazel pads. Patient voided without difficulty following removal of Foley catheter, and advancement of patient's diet and ambulation were well-tolerated. The remainder of patient's hospital course was essentially unremarkable, and discharge planning was initiated accordingly with patient safely able to be discharged home with appropriate discharge instructions, topical steroids + ice + witch hazel pads, pain control, and outpatient surgical follow-up after all of his and family's questions were answered to their expressed satisfaction.  Consults: None  Treatments: IV hydration, steroids: topical anal hydrocortisone and therapies: application of ice  Discharge Exam: Blood pressure 122/79, pulse 67, temperature 98.5 F (36.9 C), temperature source Oral, resp. rate 16, height 6' (1.829 m), weight 195 lb (88.5 kg), SpO2 98 %. General appearance: alert, cooperative and no distress GI: soft, non-tender; bowel sounds normal; no masses,  no organomegaly  Anorectal: large and medium sized decreasingly swollen and tender non-thrombosed hemorrhoids with no gross blood per rectum  Disposition: 01-Home or Self Care   Allergies as of  10/09/2017      Reactions   Penicillins Hives, Other (See Comments)   Reaction occurred while patient was taking both a penicillin and sulfa antibiotic. Patient is unsure to which he reacted.   Sulfa Antibiotics Hives, Other (See Comments)   Reaction occurred while patient was taking both a penicillin and sulfa antibiotic. Patient is unsure to which he reacted.      Medication List    TAKE these medications   docusate sodium 100 MG capsule Commonly known as:  COLACE Take 1 capsule (100 mg total) by mouth daily as needed for mild constipation.   hydrocortisone 2.5 % rectal cream Commonly known as:  ANUSOL-HC Place rectally 3 (three) times daily. To reduce hemorrhoidal swelling   lidocaine 5 % ointment Commonly known as:  XYLOCAINE Apply 1 application topically 3 (three) times daily as needed. Apply to hemorrhoids as needed for pain   witch hazel-glycerin pad Commonly known as:  TUCKS Apply topically 3 (three) times daily.      Follow-up Information    [facility]. Schedule an appointment as soon as possible for a visit.   Specialty:  General Surgery Contact information: [address] [place] [id number] [phone]          Signed: Ancil LinseyJason Evan Davis 10/10/2017, 1:59 PM

## 2017-10-11 DIAGNOSIS — K648 Other hemorrhoids: Secondary | ICD-10-CM

## 2017-10-19 ENCOUNTER — Encounter: Payer: Self-pay | Admitting: Surgery

## 2017-10-19 ENCOUNTER — Ambulatory Visit (INDEPENDENT_AMBULATORY_CARE_PROVIDER_SITE_OTHER): Payer: 59 | Admitting: Surgery

## 2017-10-19 VITALS — BP 130/75 | HR 103 | Temp 98.3°F | Ht 72.0 in | Wt 197.0 lb

## 2017-10-19 DIAGNOSIS — K648 Other hemorrhoids: Secondary | ICD-10-CM

## 2017-10-19 DIAGNOSIS — K642 Third degree hemorrhoids: Secondary | ICD-10-CM | POA: Diagnosis not present

## 2017-10-19 NOTE — Progress Notes (Signed)
Surgical Clinic Progress/Follow-up Note   HPI:  61 y.o. Male presents to clinic for follow-up evaluation of engorged and prolapsed 3rd degree hemorrhoids for which he was recently admitted to [facility]. Patient reports his pain and perianal "fullness" have improved substantially with inconsistent application of ice and topical steroids, but has not resolved entirely. He has not also used topical lidocaine cream for a few days, denies constipation, fever/chills, N/V, abdominal pain otherwise, CP, or SOB.  Review of Systems:  Constitutional: denies any other weight loss, fever, chills, or sweats  Eyes: denies any other vision changes, history of eye injury  ENT: denies sore throat, hearing problems  Respiratory: denies shortness of breath, wheezing  Cardiovascular: denies chest pain, palpitations  Gastrointestinal: abdominal pain, N/V, and bowel function as per HPI Musculoskeletal: denies any other joint pains or cramps  Skin: Denies any other rashes or skin discolorations  Neurological: denies any other headache, dizziness, weakness  Psychiatric: denies any other depression, anxiety  All other review of systems: otherwise negative   Vital Signs:  BP 130/75   Pulse (!) 103   Temp 98.3 F (36.8 C) (Oral)   Ht 6' (1.829 m)   Wt 197 lb (89.4 kg)   BMI 26.72 kg/m    Physical Exam:  Constitutional:  -- Normal body habitus  -- Awake, alert, and oriented x3  Eyes:  -- Pupils equally round and reactive to light  -- No scleral icterus  Ear, nose, throat:  -- No jugular venous distension  -- No nasal drainage, bleeding Pulmonary:  -- No crackles -- Equal breath sounds bilaterally -- Breathing non-labored at rest Cardiovascular:  -- S1, S2 present  -- No pericardial rubs  Gastrointestinal:  -- Soft, nontender, non-distended, no guarding/rebound  -- No abdominal masses appreciated, pulsatile or otherwise  Anorectal: -- Much improved (far less engorged and less prolapsed) moderately  tender non-thrombosed hemorrhoids with appropriate anal sphincter tone, no fissures/fistulas appreciated, and no gross blood Musculoskeletal / Integumentary:  -- Wounds or skin discoloration: None appreciated  -- Extremities: B/L UE and LE FROM, hands and feet warm, no edema  Neurologic:  -- Motor function: intact and symmetric  -- Sensation: intact and symmetric   Assessment:  61 y.o. yo Male with a problem list including...  Patient Active Problem List   Diagnosis Date Noted  . Inflamed internal hemorrhoid   . Complex third degree hemorrhoid 10/09/2017    presents to clinic for follow-up evaluation of less engorged and inflamed grade 3 prolapsed hemorrhoids, doing overall well but with moderate remaining swelling and inflammation along with chronic itching and pain.  Plan:   - topical lidocaine prn for pain   - routine application of topical hydrocortisone and ice to reduce swelling and engorgement for symptomatic relief as well as in preparation/anticipation of surgery  - all risks, benefits, and alternatives to elective hemorrhoidectomy (as well as risks/benefits of various approaches) were discussed with the patient and his wife, all of their questions were answered to their expressed satisfaction, patient expresses he wishes to proceed, and informed consent was obtained.  - will tentatively plan for elective hemorrhoidectomy Wednesday, 11/21 per patient's request  - return to clinic 1 week prior to anticipated hemorrhoidectomy to ensure further improved swelling and inflammation and then 2 weeks following surgery  - instructed to call office if any questions or concerns  All of the above recommendations were discussed with the patient and patient's family, and all of patient's and family's questions were answered to their expressed  satisfaction.  -- Scherrie GerlachJason E. Earlene Plateravis, MD, RPVI [place]: [facility] - Partnering for exceptional care. Office:  [phone]

## 2017-10-19 NOTE — Patient Instructions (Addendum)
You have requested to have Hemorrhoid surgery today. Please review the information below and your (Blue)Pre-Care Information. ° °You will be required to do 2 enemas prior to your surgery. The first will be the night prior and the second will be done the morning of surgery. ° °Constipation is going to be your biggest obstacle following surgery. Use all stool softeners and laxatives as prescribed after your surgery and be sure to drink 72 ounces of water or more every day. This will help to avoid constipation. If you do all of this and you are still having difficulty, please call our office for further instructions as soon as you begin to have difficulty with bowel movements. ° °You may want to buy a disposable Sitz bath prior to surgery to aid in pain and cleanliness after surgery. The information on how to do use a disposable sitz bath or using a bath tub are below. ° ° °Hemorrhoid Surgery After Care °Refer to this sheet in the next few weeks. These instructions provide you with information about caring for yourself after your procedure. Your health care provider may also give you more specific instructions. Your treatment has been planned according to current medical practices, but problems sometimes occur. Call your health care provider if you have any problems or questions after your procedure. °What can I expect after the procedure? °After the procedure, it is common to have: °· Rectal pain. °· Pain when you are having a bowel movement. °· Slight rectal bleeding. ° °Follow these instructions at home: °Medicines °· Take over-the-counter and prescription medicines only as told by your health care provider. °· Do not drive or operate heavy machinery while taking prescription pain medicine. °· Use a stool softener or a bulk laxative as told by your health care provider. °Activity °· Rest at home. Return to your normal activities as told by your health care provider. °· Do not lift anything that is heavier than 10 lb  (4.5 kg). °· Do not sit for long periods of time. Take a walk every day or as told by your health care provider. °· Do not strain to have a bowel movement. Do not spend a long time sitting on the toilet. °Eating and drinking °· Eat foods that contain fiber, such as whole grains, beans, nuts, fruits, and vegetables. °· Drink enough fluid to keep your urine clear or pale yellow. °General instructions °· Sit in a warm bath 2-3 times per day to relieve soreness or itching. °· Keep all follow-up visits as told by your health care provider. This is important. °Contact a health care provider if: °· Your pain medicine is not helping. °· You have a fever or chills. °· You become constipated. °· You have trouble passing urine. °Get help right away if: °· You have very bad rectal pain. °· You have heavy bleeding from your rectum. °This information is not intended to replace advice given to you by your health care provider. Make sure you discuss any questions you have with your health care provider. °Document Released: 02/25/2004 Document Revised: 05/12/2016 Document Reviewed: 03/02/2015 °Elsevier Interactive Patient Education © 2018 Elsevier Inc. ° ° °Disposable Sitz Bath °A disposable sitz bath is a plastic basin that fits over the toilet. A bag is hung above the toilet, and the bag is connected to a tube that opens into the basin. The bag is filled with warm water that flows into the basin through the tube. A sitz bath can be used to help relieve symptoms, clean,   and promote healing in the genital and anal areas, as well as in the lower abdomen and buttocks. °What are the risks? °Sitz baths are generally very safe. It is possible for the skin between the genitals and the anus (perineum) to become infected, but this is rare. You can avoid this by cleaning your sitz bath supplies thoroughly. °How to use a disposable sitz bath °1. Close the clamp on the tube. Make sure the clamp is closed tightly to prevent leakage. °2. Fill  the sitz bath basin and the plastic bag with warm water. The water should be warm enough to be comfortable, but not hot. °3. Raise the toilet seat and place the filled basin on the toilet. Make sure the overflow opening is facing toward the back of the toilet. °? If you prefer, you may place the empty basin on the toilet first, and then use the plastic bag to fill the basin with warm water. °4. Hang the filled plastic bag overhead on a hook or towel rack close to the toilet. The bag should be higher than the toilet so that the water will flow down through the tube. °5. Attach the tube to the opening on the basin. Make sure that the tube is attached to the basin tightly to prevent leakage. °6. Sit on the basin and release the clamp. This will allow warm water to flow into the basin and flush the area around your genitals and anus. °7. Remain sitting on the basin for about 15-20 minutes, or as long as told by your health care provider. °8. Stand up and gently pat your skin dry. If directed, apply clean bandages (dressings) to the affected area as told by your health care provider. °9. Carefully remove the basin from the toilet seat and tip the basin into the toilet to empty any remaining water. Empty any remaining water from the plastic bag into the toilet. Then, flush the toilet. °10. Wash the basin with warm water and soap. Let the basin air dry in the sink. You should also let the plastic bag and the tubing air dry. °11. Store the basin, tubing, and plastic bag in a clean, dry area. °12. Wash your hands with soap and water. If soap and water are not available, use hand sanitizer. °Contact a health care provider if: °· You have symptoms that get worse instead of better. °· You develop new skin irritation, redness, or swelling around your genitals or anus. °This information is not intended to replace advice given to you by your health care provider. Make sure you discuss any questions you have with your health care  provider. °Document Released: 06/05/2012 Document Revised: 05/12/2016 Document Reviewed: 10/25/2015 °Elsevier Interactive Patient Education © 2018 Elsevier Inc. ° ° °How to Take a Sitz Bath °A sitz bath is a warm water bath that is taken while you are sitting down. The water should only come up to your hips and should cover your buttocks. Your health care provider may recommend a sitz bath to help you: °· Clean the lower part of your body, including your genital area. °· With itching. °· With pain. °· With sore muscles or muscles that tighten or spasm. ° °How to take a sitz bath °Take 3-4 sitz baths per day or as told by your health care provider. °1. Partially fill a bathtub with warm water. You will only need the water to be deep enough to cover your hips and buttocks when you are sitting in it. °2. If your   health care provider told you to put medicine in the water, follow the directions exactly. °3. Sit in the water and open the tub drain a little. °4. Turn on the warm water again to keep the tub at the correct level. Keep the water running constantly. °5. Soak in the water for 15-20 minutes or as told by your health care provider. °6. After the sitz bath, pat the affected area dry first. Do not rub it. °7. Be careful when you stand up after the sitz bath because you may feel dizzy. ° °Contact a health care provider if: °· Your symptoms get worse. Do not continue with sitz baths if your symptoms get worse. °· You have new symptoms. Do not continue with sitz baths until you talk with your health care provider. °This information is not intended to replace advice given to you by your health care provider. Make sure you discuss any questions you have with your health care provider. °Document Released: 08/27/2004 Document Revised: 05/04/2016 Document Reviewed: 12/03/2014 °Elsevier Interactive Patient Education © 2018 Elsevier Inc. ° ° ° °

## 2017-10-20 NOTE — Addendum Note (Signed)
Addended by: Chrisandra NettersAVIS, Monnie Anspach E on: 10/20/2017 10:53 AM   Modules accepted: Orders, SmartSet

## 2017-10-24 ENCOUNTER — Telehealth: Payer: Self-pay | Admitting: Surgery

## 2017-10-24 NOTE — Telephone Encounter (Signed)
I have called patient to go over surgery information. No answer. I have left a message for patient to call back to obtain the information below.    Sx: 11/08/17 with Dr Davis--hemorrhoidectomy.  Pre op: 11/02/17 between 9-1:00pm--Phone interview.   Please make Patient aware to call [phone], between 1-3:00pm the day before surgery, to find out what time to arrive.

## 2017-10-24 NOTE — Telephone Encounter (Signed)
Patient has called and all surgery information has been given. Patient states that he can not come on 11/15 due to him being out of town. I have moved his appt with Dr Earlene Plateravis to 11/12.

## 2017-10-30 ENCOUNTER — Ambulatory Visit: Payer: 59 | Admitting: Surgery

## 2017-11-01 ENCOUNTER — Telehealth: Payer: Self-pay | Admitting: Surgery

## 2017-11-01 NOTE — Telephone Encounter (Signed)
Patient has called and cancelled surgery for 11/08/17 with Dr Davis--hemorrhoidectomy. Patient has went out of town due to a death in the family. He states that he does want to have this surgery done by the end of the year. He will call back once he is back in town to reschedule.

## 2017-11-02 ENCOUNTER — Ambulatory Visit: Payer: 59 | Admitting: Surgery

## 2017-11-02 ENCOUNTER — Inpatient Hospital Stay: Admission: RE | Admit: 2017-11-02 | Payer: 59 | Source: Ambulatory Visit

## 2017-11-08 ENCOUNTER — Ambulatory Visit: Admission: RE | Admit: 2017-11-08 | Payer: 59 | Source: Ambulatory Visit | Admitting: Surgery

## 2017-11-08 ENCOUNTER — Encounter: Admission: RE | Payer: Self-pay | Source: Ambulatory Visit

## 2017-11-08 SURGERY — HEMORRHOIDECTOMY
Anesthesia: Choice

## 2017-11-13 ENCOUNTER — Telehealth: Payer: Self-pay | Admitting: Surgery

## 2017-11-13 NOTE — Telephone Encounter (Signed)
I have called patient back after discussion with Dr Earlene Plateravis. Patient will see Dr Earlene Plateravis back in the office to discuss surgery and re-evaluate on 11/23/17. Patient is tenativily scheduled for surgery on 12/06/17 with Dr Earlene Plateravis.

## 2017-11-13 NOTE — Telephone Encounter (Signed)
Patient would like to reschedule surgery - please call

## 2017-11-13 NOTE — Telephone Encounter (Signed)
Patient states that he cannot do the next 2 Fridays but is open otherwise.

## 2017-11-23 ENCOUNTER — Ambulatory Visit (INDEPENDENT_AMBULATORY_CARE_PROVIDER_SITE_OTHER): Payer: 59 | Admitting: Surgery

## 2017-11-23 ENCOUNTER — Encounter: Payer: Self-pay | Admitting: Surgery

## 2017-11-23 VITALS — BP 118/81 | HR 111 | Temp 98.0°F | Ht 72.0 in | Wt 198.0 lb

## 2017-11-23 DIAGNOSIS — K642 Third degree hemorrhoids: Secondary | ICD-10-CM

## 2017-11-23 DIAGNOSIS — K648 Other hemorrhoids: Secondary | ICD-10-CM | POA: Diagnosis not present

## 2017-11-23 NOTE — Patient Instructions (Signed)
At this time we do not see the need to proceed with surgery. However we recommend that you apply the steroid cream and medications as needed. Also applying ice and sitz baths will help as well. If you feel that you want to proceed with the surgery please give our office a call   Disposable Sitz Bath A disposable sitz bath is a plastic basin that fits over the toilet. A bag is hung above the toilet, and the bag is connected to a tube that opens into the basin. The bag is filled with warm water that flows into the basin through the tube. A sitz bath can be used to help relieve symptoms, clean, and promote healing in the genital and anal areas, as well as in the lower abdomen and buttocks. What are the risks? Sitz baths are generally very safe. It is possible for the skin between the genitals and the anus (perineum) to become infected, but this is rare. You can avoid this by cleaning your sitz bath supplies thoroughly. How to use a disposable sitz bath 1. Close the clamp on the tube. Make sure the clamp is closed tightly to prevent leakage. 2. Fill the sitz bath basin and the plastic bag with warm water. The water should be warm enough to be comfortable, but not hot. 3. Raise the toilet seat and place the filled basin on the toilet. Make sure the overflow opening is facing toward the back of the toilet. ? If you prefer, you may place the empty basin on the toilet first, and then use the plastic bag to fill the basin with warm water. 4. Hang the filled plastic bag overhead on a hook or towel rack close to the toilet. The bag should be higher than the toilet so that the water will flow down through the tube. 5. Attach the tube to the opening on the basin. Make sure that the tube is attached to the basin tightly to prevent leakage. 6. Sit on the basin and release the clamp. This will allow warm water to flow into the basin and flush the area around your genitals and anus. 7. Remain sitting on the basin for  about 15-20 minutes, or as long as told by your health care provider. 8. Stand up and gently pat your skin dry. If directed, apply clean bandages (dressings) to the affected area as told by your health care provider. 9. Carefully remove the basin from the toilet seat and tip the basin into the toilet to empty any remaining water. Empty any remaining water from the plastic bag into the toilet. Then, flush the toilet. 10. Wash the basin with warm water and soap. Let the basin air dry in the sink. You should also let the plastic bag and the tubing air dry. 11. Store the basin, tubing, and plastic bag in a clean, dry area. 12. Wash your hands with soap and water. If soap and water are not available, use hand sanitizer. Contact a health care provider if:  You have symptoms that get worse instead of better.  You develop new skin irritation, redness, or swelling around your genitals or anus. This information is not intended to replace advice given to you by your health care provider. Make sure you discuss any questions you have with your health care provider. Document Released: 06/05/2012 Document Revised: 05/12/2016 Document Reviewed: 10/25/2015 Elsevier Interactive Patient Education  2018 [employer]   How to Take a [employer]Sitz Bath A sitz bath is a warm water bath  that is taken while you are sitting down. The water should only come up to your hips and should cover your buttocks. Your health care provider may recommend a sitz bath to help you:  Clean the lower part of your body, including your genital area.  With itching.  With pain.  With sore muscles or muscles that tighten or spasm.  How to take a sitz bath Take 3-4 sitz baths per day or as told by your health care provider. 1. Partially fill a bathtub with warm water. You will only need the water to be deep enough to cover your hips and buttocks when you are sitting in it. 2. If your health care provider told you to put medicine in the  water, follow the directions exactly. 3. Sit in the water and open the tub drain a little. 4. Turn on the warm water again to keep the tub at the correct level. Keep the water running constantly. 5. Soak in the water for 15-20 minutes or as told by your health care provider. 6. After the sitz bath, pat the affected area dry first. Do not rub it. 7. Be careful when you stand up after the sitz bath because you may feel dizzy.  Contact a health care provider if:  Your symptoms get worse. Do not continue with sitz baths if your symptoms get worse.  You have new symptoms. Do not continue with sitz baths until you talk with your health care provider. This information is not intended to replace advice given to you by your health care provider. Make sure you discuss any questions you have with your health care provider. Document Released: 08/27/2004 Document Revised: 05/04/2016 Document Reviewed: 12/03/2014 Elsevier Interactive Patient Education  [employer]2018 Elsevier Inc.

## 2017-11-27 ENCOUNTER — Inpatient Hospital Stay: Admission: RE | Admit: 2017-11-27 | Payer: 59 | Source: Ambulatory Visit

## 2017-11-29 ENCOUNTER — Encounter: Payer: Self-pay | Admitting: Surgery

## 2017-11-29 NOTE — Progress Notes (Signed)
Surgical Clinic Progress/Follow-up Note   HPI:  61 y.o. Male presents to clinic for follow-up evaluation of recently severely engorged and painful non-bleeding 3rd-degree internal hemorrhoids. Patient was scheduled for outpatient hemorrhoidectomy 11/21, but he needed to cancel surgery due to a family emergency. At that time, he described he was feeling much better, though his sensation of "fullness" had not resolved entirely. He currently reports he feels "better than has in years" from perspective regarding his hemorrhoids, denies any further "fullness", pain, or bleeding and has been passing flatus and daily BM's WNL without constipation or diarrhea. He otherwise denies any N/V, fever/chills, CP, or SOB despite having been physically active.  Review of Systems:  Constitutional: denies any other weight loss, fever, chills, or sweats  Eyes: denies any other vision changes, history of eye injury  ENT: denies sore throat, hearing problems  Respiratory: denies shortness of breath, wheezing  Cardiovascular: denies chest pain, palpitations  Gastrointestinal: abdominal pain, N/V, and bowel function as per HPI Musculoskeletal: denies any other joint pains or cramps  Skin: Denies any other rashes or skin discolorations  Neurological: denies any other headache, dizziness, weakness  Psychiatric: denies any other depression, anxiety  All other review of systems: otherwise negative   Vital Signs:  BP 118/81   Pulse (!) 111   Temp 98 F (36.7 C) (Oral)   Ht 6' (1.829 m)   Wt 198 lb (89.8 kg)   BMI 26.85 kg/m    Physical Exam:  Constitutional:  -- Normal body habitus  -- Awake, alert, and oriented x3  Eyes:  -- Pupils equally round and reactive to light  -- No scleral icterus  Ear, nose, throat:  -- No jugular venous distension  -- No nasal drainage, bleeding Pulmonary:  -- No crackles -- Equal breath sounds bilaterally -- Breathing non-labored at rest Cardiovascular:  -- S1, S2  present  -- No pericardial rubs  Gastrointestinal:  -- Soft, nontender, non-distended, no guarding/rebound  -- No abdominal masses appreciated, pulsatile or otherwise Anorectal: -- No longer tender and currently non-prolapsed internal hemorrhoids with anal sphincter tone WNL without gross blood, no fissures/fistulas appreciated Musculoskeletal / Integumentary:  -- Wounds or skin discoloration: None appreciated  -- Extremities: B/L UE and LE FROM, hands and feet warm, no edema  Neurologic:  -- Motor function: intact and symmetric  -- Sensation: intact and symmetric   Laboratory studies:  CBC:  Lab Results  Component Value Date   WBC 11.1 (H) 10/09/2017   RBC 5.46 10/09/2017   BMP:  Lab Results  Component Value Date   GLUCOSE 116 (H) 10/09/2017   CO2 24 10/09/2017   BUN 29 (H) 10/09/2017   CREATININE 0.92 10/09/2017   CALCIUM 9.6 10/09/2017     Imaging: No new pertinent imaging studies   Assessment:  61 y.o. yo Male with a problem list including...  Patient Active Problem List   Diagnosis Date Noted  . Inflamed internal hemorrhoid   . Complex third degree hemorrhoid 10/09/2017    presents to clinic for follow-up evaluation of recently engorged and painful, now completely asymptomatic, internal hemorrhoids without evidence to suggest bleeding.  Plan:   - though patient says he's already met his insurance deductible this year and did recently experience severe anorectal symptoms requiring hospital admission and attributed to his hemorrhoids, he says that he is currently feeling well and does not wish to undergo elective surgery at this time  - encouraged patient not to let hemorrhoidal swelling and pain progress to severity  he recently experienced  - adequate hydration, high fiber diet, and stool softener as needed to avoid/minimize constipation  - if hemorrhoidal pain or swelling, ice and topical hydrocortisone +/- topical lidocaine prn  - return to clinic as needed,  instructed to call office if any questions or concerns  All of the above recommendations were discussed with the patient, and all of patient's questions were answered to his expressed satisfaction.  -- Marilynne Drivers Rosana Hoes, MD, [place]: [facility] - Partnering for exceptional care. Office: [phone]

## 2017-12-06 ENCOUNTER — Ambulatory Visit: Admit: 2017-12-06 | Payer: 59 | Admitting: Surgery

## 2017-12-06 SURGERY — HEMORRHOIDECTOMY
Anesthesia: Choice

## 2018-04-13 LAB — HIV ANTIBODY (ROUTINE TESTING W REFLEX): HIV SCREEN 4TH GENERATION: NONREACTIVE

## 2022-03-15 ENCOUNTER — Ambulatory Visit (INDEPENDENT_AMBULATORY_CARE_PROVIDER_SITE_OTHER): Payer: Medicare Other | Admitting: Physician Assistant

## 2022-03-15 ENCOUNTER — Encounter: Payer: Self-pay | Admitting: Physician Assistant

## 2022-03-15 VITALS — BP 110/70 | HR 78 | Ht 71.0 in | Wt 178.0 lb

## 2022-03-15 DIAGNOSIS — Z1212 Encounter for screening for malignant neoplasm of rectum: Secondary | ICD-10-CM

## 2022-03-15 DIAGNOSIS — Z1211 Encounter for screening for malignant neoplasm of colon: Secondary | ICD-10-CM | POA: Diagnosis not present

## 2022-03-15 DIAGNOSIS — K649 Unspecified hemorrhoids: Secondary | ICD-10-CM | POA: Diagnosis not present

## 2022-03-15 MED ORDER — NA SULFATE-K SULFATE-MG SULF 17.5-3.13-1.6 GM/177ML PO SOLN: 1.0000 | Freq: Once | ORAL | 0 refills | Status: AC

## 2022-03-15 NOTE — Progress Notes (Signed)
? ?Chief Complaint: Hemorrhoids and need for screening colonoscopy ? ?HPI: ?   Leroy Matthews is a 66 year old transgender male with a past medical history as listed below, previously seen by Dr. Servando Snare at [facility], who presents clinic today for complaint of hemorrhoids and discuss screening colonoscopy. ?   2009 it looks like patient had a procedure with Dr. Daleen Squibb.  I cannot see report. ?   11/23/2017 patient seen for severely engorged and painful nonbleeding third-degree internal hemorrhoids.  At that time cancelled elective hemorrhoidectomy. ?   Today, the patient tells me that he has always had some trouble with hemorrhoids off and on, starting back in 2018.  At one point he was scheduled to have surgery for these but decided not to.  Now he tells me that they will "flare" about once a quarter and they are painful with some bright red blood for which he uses over-the-counter creams.  It typically comes on when he is not drinking enough water and becomes slightly constipated.  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  Tells me he thinks he is due for another colonoscopy anyways and would like to get the work-up and possibly have hemorrhoidectomy if he needs it. ?   Denies fever, chills, weight loss or abdominal pain. ? ?Past Medical History:  ?Diagnosis Date  ? Complex third degree hemorrhoid 10/09/2017  ? Inflamed internal hemorrhoid   ? ? ?No past surgical history on file. ? ?Current Outpatient Medications  ?Medication Sig Dispense Refill  ? Ascorbic Acid (VITAMIN C PO) Take 1 tablet daily by mouth.    ? atovaquone-proguanil (MALARONE) 250-100 MG TABS tablet Take by mouth.    ? CALCIUM-VITAMIN D PO Take 1 tablet daily by mouth.    ? Cholecalciferol (VITAMIN D3 PO) Take 1 capsule daily by mouth.    ? hydrocortisone (ANUSOL-HC) 2.5 % rectal cream Place rectally 3 (three) times daily. To reduce hemorrhoidal swelling (Patient taking differently: Place 1 application 3 (three) times daily  rectally. To reduce hemorrhoidal swelling) 30 g 0  ? lidocaine (XYLOCAINE) 5 % ointment Apply 1 application topically 3 (three) times daily as needed. Apply to hemorrhoids as needed for pain 35.44 g 0  ? Multiple Vitamin (MULTIVITAMIN) tablet Take by mouth.    ? VITAMIN E PO Take 1 capsule daily by mouth.    ? witch hazel-glycerin (TUCKS) pad Apply topically 3 (three) times daily. 40 each 2  ? ?No current facility-administered medications for this visit.  ? ? ?Allergies as of 03/15/2022 - Review Complete 11/29/2017  ?Allergen Reaction Noted  ? Penicillins Hives, Swelling, and Other (See Comments) 10/09/2017  ? Sulfa antibiotics Hives and Other (See Comments) 10/09/2017  ? ? ?Family History  ?Problem Relation Age of Onset  ? Alzheimer's disease Mother   ? Atrial fibrillation Mother   ? Heart disease Father   ? ? ?Social History  ? ?Socioeconomic History  ? Marital status: Married  ?  Spouse name: Not on file  ? Number of children: Not on file  ? Years of education: Not on file  ? Highest education level: Not on file  ?Occupational History  ? Not on file  ?Tobacco Use  ? Smoking status: Every Day  ?  Types: Cigarettes  ? Smokeless tobacco: Never  ? Tobacco comments:  ?  5-6 cigs a day  ?Substance and Sexual Activity  ? Alcohol use: No  ? Drug use: No  ? Sexual  activity: Not on file  ?Other Topics Concern  ? Not on file  ?Social History Narrative  ? Not on file  ? ?Social Determinants of Health  ? ?Financial Resource Strain: Not on file  ?Food Insecurity: Not on file  ?Transportation Needs: Not on file  ?Physical Activity: Not on file  ?Stress: Not on file  ?Social Connections: Not on file  ?Intimate Partner Violence: Not on file  ? ? ?Review of Systems:    ?Constitutional: No weight loss, fever or chills ?Skin: No rash  ?Cardiovascular: No chest pain ?Respiratory: No SOB ?Gastrointestinal: See HPI and otherwise negative ?Genitourinary: No dysuria  ?Neurological: No headache, dizziness or syncope ?Musculoskeletal: No  new muscle or joint pain ?Hematologic: No bruising ?Psychiatric: No history of depression or anxiety ? ? Physical Exam:  ?Vital signs: ?BP 110/70   Pulse 78   Ht 5\' 11"  (1.803 m)   Wt 178 lb (80.7 kg)   BMI 24.83 kg/m?   ? ?Constitutional:   Pleasant Caucasian male appears to be in NAD, Well developed, Well nourished, alert and cooperative ?Head:  Normocephalic and atraumatic. ?Eyes:   PEERL, EOMI. No icterus. Conjunctiva pink. ?Ears:  Normal auditory acuity. ?Neck:  Supple ?Throat: Oral cavity and pharynx without inflammation, swelling or lesion.  ?Respiratory: Respirations even and unlabored. Lungs clear to auscultation bilaterally.   No wheezes, crackles, or rhonchi.  ?Cardiovascular: Normal S1, S2. No MRG. Regular rate and rhythm. No peripheral edema, cyanosis or pallor.  ?Gastrointestinal:  Soft, nondistended, nontender. No rebound or guarding. Normal bowel sounds. No appreciable masses or hepatomegaly. ?Rectal:  Declined ?Msk:  Symmetrical without gross deformities. Without edema, no deformity or joint abnormality.  ?Neurologic:  Alert and  oriented x4;  grossly normal neurologically.  ?Skin:   Dry and intact without significant lesions or rashes. ?Psychiatric: Demonstrates good judgement and reason without abnormal affect or behaviors. ? ?No recent labs or imaging. ? ?Assessment: ?1.  Screening for colorectal cancer: Last colonoscopy in 2009 was normal per the patient, I cannot see report in computer ?2.  Hemorrhoids: History of complex hemorrhoids in the past, was recommended he have a hemorrhoidectomy but never had this done, now with a "flare" about 4 times a year with constipation ? ?Plan: ?1.  Patient would like to be scheduled soon for a screening colonoscopy given that his extra insurance runs out July 1.  He wants to get everything done before then if he possibly needs a hemorrhoidectomy.  He requested to follow with Dr. [id number] but he did not have availability until May.  Patient was scheduled  for a screening colonoscopy in the [facility] with Dr. June.  Did provide the patient a detailed list of risks for the procedure and he agrees to proceed. ?2.  Discussed constipation today, recommend that he start a fiber supplement such as Metamucil, Citrucel or Benefiber and ensure that he is drinking plenty of water. ?3.  Patient follow in clinic per recommendations from Dr. Tomasa Rand after time of procedure.  Dr. Tomasa Rand will be his primary GI physician here. ? ?Tomasa Rand, PA-C ?[facility] ?03/15/2022, 8:29 AM ? ? ?

## 2022-03-15 NOTE — Patient Instructions (Signed)
If you are age 68 or older, your body mass index should be between 23-30. Your Body mass index is 24.83 kg/m?Marland Kitchen If this is out of the aforementioned range listed, please consider follow up with your Primary Care Provider. ? ?If you are age 70 or younger, your body mass index should be between 19-25. Your Body mass index is 24.83 kg/m?Marland Kitchen If this is out of the aformentioned range listed, please consider follow up with your Primary Care Provider.  ? ?________________________________________________________ ? ?The [facility] providers would like to encourage you to use [facility] to communicate with providers for non-urgent requests or questions.  Due to long hold times on the telephone, sending your provider a message by [facility] may be a faster and more efficient way to get a response.  Please allow 48 business hours for a response.  Please remember that this is for non-urgent requests.  ?_______________________________________________________ ? ?You have been scheduled for a colonoscopy. Please follow written instructions given to you at your visit today.  ?Please pick up your prep supplies at the pharmacy within the next 1-3 days. ?If you use inhalers (even only as needed), please bring them with you on the day of your procedure. ? ?It was a pleasure to see you today! ? ?Thank you for trusting me with your gastrointestinal care!   ? ? ?

## 2022-03-16 NOTE — Progress Notes (Signed)
Agree with the assessment and plan as outlined by Jennifer Lemmon, PA-C. ? ?Sondi Desch E. Dayanne Yiu, MD ? ?

## 2022-04-13 ENCOUNTER — Encounter: Payer: Self-pay | Admitting: Gastroenterology

## 2022-04-13 ENCOUNTER — Ambulatory Visit (AMBULATORY_SURGERY_CENTER): Payer: Medicare Other | Admitting: Gastroenterology

## 2022-04-13 VITALS — BP 95/60 | HR 72 | Temp 97.7°F | Resp 13 | Ht 71.0 in | Wt 178.0 lb

## 2022-04-13 DIAGNOSIS — Z1211 Encounter for screening for malignant neoplasm of colon: Secondary | ICD-10-CM | POA: Diagnosis not present

## 2022-04-13 DIAGNOSIS — Z1212 Encounter for screening for malignant neoplasm of rectum: Secondary | ICD-10-CM | POA: Diagnosis not present

## 2022-04-13 MED ORDER — SODIUM CHLORIDE 0.9 % IV SOLN: 500.0000 mL | Freq: Once | INTRAVENOUS | Status: AC

## 2022-04-13 NOTE — Op Note (Signed)
[facility] ?Patient Name: Leroy Matthews ?Procedure Date: 04/13/2022 1:20 PM ?MRN: [id number] ?Endoscopist: Clayden Withem E. Tomasa Rand , MD ?Age: 66 ?Referring MD:  ?Date of Birth: 1956-01-17 ?Gender: Male ?Account #: [id number] ?Procedure:                Colonoscopy ?Indications:              Screening for colorectal malignant neoplasm (last  ?                          colonoscopy was more than 10 years ago) ?Medicines:                Monitored Anesthesia Care ?Procedure:                Pre-Anesthesia Assessment: ?                          - Prior to the procedure, a History and Physical  ?                          was performed, and patient medications and  ?                          allergies were reviewed. The patient's tolerance of  ?                          previous anesthesia was also reviewed. The risks  ?                          and benefits of the procedure and the sedation  ?                          options and risks were discussed with the patient.  ?                          All questions were answered, and informed consent  ?                          was obtained. Prior Anticoagulants: The patient has  ?                          taken no previous anticoagulant or antiplatelet  ?                          agents. ASA Grade Assessment: II - A patient with  ?                          mild systemic disease. After reviewing the risks  ?                          and benefits, the patient was deemed in  ?                          satisfactory condition to undergo the procedure. ?  After obtaining informed consent, the colonoscope  ?                          was passed under direct vision. Throughout the  ?                          procedure, the patient's blood pressure, pulse, and  ?                          oxygen saturations were monitored continuously. The  ?                          Olympus [id number] was introduced through the anus  ?                          and advanced to  the the terminal ileum, with  ?                          identification of the appendiceal orifice and IC  ?                          valve. The colonoscopy was performed without  ?                          difficulty. The patient tolerated the procedure  ?                          well. The quality of the bowel preparation was  ?                          adequate. The terminal ileum, ileocecal valve,  ?                          appendiceal orifice, and rectum were photographed.  ?                          The bowel preparation used was SUPREP via split  ?                          dose instruction. ?Scope In: 2:08:06 PM ?Scope Out: 2:27:37 PM ?Scope Withdrawal Time: 0 hours 13 minutes 59 seconds  ?Total Procedure Duration: 0 hours 19 minutes 31 seconds  ?Findings:                 The perianal and digital rectal examinations were  ?                          normal. Pertinent negatives include normal  ?                          sphincter tone and no palpable rectal lesions. ?                          Many small and large-mouthed diverticula were found  ?  in the sigmoid colon and descending colon. There  ?                          was no evidence of diverticular bleeding. ?                          A single small angiodysplastic lesion without  ?                          bleeding was found in the transverse colon. ?                          The exam was otherwise normal throughout the  ?                          examined colon. ?                          The terminal ileum appeared normal. ?                          Non-bleeding internal hemorrhoids were found during  ?                          retroflexion. The hemorrhoids were large and Grade  ?                          III (internal hemorrhoids that prolapse but require  ?                          manual reduction). ?                          No additional abnormalities were found on  ?                          retroflexion. ?Complications:             No immediate complications. ?Estimated Blood Loss:     Estimated blood loss: none. ?Impression:               - Moderate diverticulosis in the sigmoid colon and  ?                          in the descending colon. There was no evidence of  ?                          diverticular bleeding. ?                          - A single non-bleeding colonic angiodysplastic  ?                          lesion. ?                          - The examined portion of the ileum was normal. ?                          -  Non-bleeding internal hemorrhoids. ?                          - No specimens collected. ?Recommendation:           - Patient has a contact number available for  ?                          emergencies. The signs and symptoms of potential  ?                          delayed complications were discussed with the  ?                          patient. Return to normal activities tomorrow.  ?                          Written discharge instructions were provided to the  ?                          patient. ?                          - Resume previous diet. ?                          - Continue present medications. ?                          - Repeat colonoscopy in 10 years for screening  ?                          purposes (recommend discussion with PCP about  ?                          benefits of ongoing screening at that time, given  ?                          age 67>75). ?                          - Consider hemorrhoid banding vs hemorrhoidectomy  ?                          for internal hemorrhoids ?                          - Recommend daily fiber supplementation to optimize  ?                          stool consistency and regularity ?Leroy Matthews E. Tomasa Randunningham, MD ?04/13/2022 2:37:37 PM ?This report has been signed electronically. ?

## 2022-04-13 NOTE — Progress Notes (Signed)
PT taken to PACU. Monitors in place. VSS. Report given to RN. 

## 2022-04-13 NOTE — Patient Instructions (Signed)
Please read handouts provided. ?Continue present medications. ?Repeat colonoscopy in 10 years for screening. ?Consider daily fiber supplementation. ? ?YOU HAD AN ENDOSCOPIC PROCEDURE TODAY AT [facility]:   Refer to the procedure report that was given to you for any specific questions about what was found during the examination.  If the procedure report does not answer your questions, please call your gastroenterologist to clarify.  If you requested that your care partner not be given the details of your procedure findings, then the procedure report has been included in a sealed envelope for you to review at your convenience later. ? ?YOU SHOULD EXPECT: Some feelings of bloating in the abdomen. Passage of more gas than usual.  Walking can help get rid of the air that was put into your GI tract during the procedure and reduce the bloating. If you had a lower endoscopy (such as a colonoscopy or flexible sigmoidoscopy) you may notice spotting of blood in your stool or on the toilet paper. If you underwent a bowel prep for your procedure, you may not have a normal bowel movement for a few days. ? ?Please Note:  You might notice some irritation and congestion in your nose or some drainage.  This is from the oxygen used during your procedure.  There is no need for concern and it should clear up in a day or so. ? ?SYMPTOMS TO REPORT IMMEDIATELY: ? ?Following lower endoscopy (colonoscopy or flexible sigmoidoscopy): ? Excessive amounts of blood in the stool ? Significant tenderness or worsening of abdominal pains ? Swelling of the abdomen that is new, acute ? Fever of 100?F or higher ? ?For urgent or emergent issues, a gastroenterologist can be reached at any hour by calling [phone]. ?Do not use MyChart messaging for urgent concerns.  ? ? ?DIET:  We do recommend a small meal at first, but then you may proceed to your regular diet.  Drink plenty of fluids but you should avoid alcoholic beverages for 24  hours. ? ?ACTIVITY:  You should plan to take it easy for the rest of today and you should NOT DRIVE or use heavy machinery until tomorrow (because of the sedation medicines used during the test).   ? ?FOLLOW UP: ?Our staff will call the number listed on your records 48-72 hours following your procedure to check on you and address any questions or concerns that you may have regarding the information given to you following your procedure. If we do not reach you, we will leave a message.  We will attempt to reach you two times.  During this call, we will ask if you have developed any symptoms of COVID 19. If you develop any symptoms (ie: fever, flu-like symptoms, shortness of breath, cough etc.) before then, please call [phone].  If you test positive for Covid 19 in the 2 weeks post procedure, please call and report this information to [place].   ? ?If any biopsies were taken you will be contacted by phone or by letter within the next 1-3 weeks.  Please call us at [phone] if you have not heard about the biopsies in 3 weeks.  ? ? ?SIGNATURES/CONFIDENTIALITY: ?You and/or your care partner have signed paperwork which will be entered into your electronic medical record.  These signatures attest to the fact that that the information above on your After Visit Summary has been reviewed and is understood.  Full responsibility of the confidentiality of this discharge information lies with you and/or your care-partner.  ?

## 2022-04-13 NOTE — Progress Notes (Signed)
History and Physical Interval Note: ? ?04/13/2022 ?1:58 PM ? ?Cliffton Asters [place]  has presented today for endoscopic procedure(s), with the diagnosis of  ?Encounter Diagnosis  ?Name Primary?  ? Screening for colorectal cancer Yes  ?Marland Kitchen  The various methods of evaluation and treatment have been discussed with the patient and/or family. After consideration of risks, benefits and other options for treatment, the patient has consented to  the endoscopic procedure(s). ? ? The patient's history has been reviewed, patient examined, no change in status, stable for endoscopic procedure(s).  I have reviewed the patient's chart and labs.  Questions were answered to the patient's satisfaction.   ? ? ?Veralyn Lopp E. Tomasa Rand, MD ?[facility] ? ? ?

## 2022-04-13 NOTE — Progress Notes (Signed)
Pt's states no medical or surgical changes since previsit or office visit. 

## 2022-04-15 ENCOUNTER — Telehealth: Payer: Self-pay | Admitting: *Deleted

## 2022-04-15 NOTE — Telephone Encounter (Signed)
?  Follow up Call- ? ? ?  04/13/2022  ?  1:16 PM  ?Call back number  ?Post procedure Call Back phone  # [phone]  ?Permission to leave phone message Yes  ?  ? ?Patient questions: ? ?Do you have a fever, pain , or abdominal swelling? No. ?Pain Score  0 * ? ?Have you tolerated food without any problems? Yes.   ? ?Have you been able to return to your normal activities? Yes.   ? ?Do you have any questions about your discharge instructions: ?Diet   No. ?Medications  No. ?Follow up visit  No. ? ?Do you have questions or concerns about your Care? No. ? ?Actions: ?* If pain score is 4 or above: ?No action needed, pain <4. ? ? ?

## 2022-04-15 NOTE — Telephone Encounter (Signed)
Attempted f/u phone call. No answer. Left message. °

## 2022-05-17 ENCOUNTER — Ambulatory Visit (INDEPENDENT_AMBULATORY_CARE_PROVIDER_SITE_OTHER): Payer: Medicare Other | Admitting: Gastroenterology

## 2022-05-17 ENCOUNTER — Encounter: Payer: Self-pay | Admitting: Gastroenterology

## 2022-05-17 DIAGNOSIS — K642 Third degree hemorrhoids: Secondary | ICD-10-CM | POA: Diagnosis not present

## 2022-05-17 NOTE — Progress Notes (Signed)
PROCEDURE NOTE: The patient presents with symptomatic grade 3  hemorrhoids, requesting rubber band ligation of his/her hemorrhoidal disease.  All risks, benefits and alternative forms of therapy were described and informed consent was obtained.   The anorectum was pre-medicated with topical lidocaine (5%) and nitroglycerin (0.125%) The decision was made to band the left lateral internal hemorrhoid, and the [facility] O'Regan System was used to perform band ligation without complication.  Digital anorectal examination was then performed to assure proper positioning of the band, and to adjust the banded tissue as required.  The patient was discharged home without pain or other issues.  Dietary and behavioral recommendations were given and along with follow-up instructions.     The following adjunctive treatments were recommended:  Fiber supplementation Adequate water intake Avoidance of straining and hard stools  The patient will return in 2-4 weeks for  follow-up and possible additional banding as required. No complications were encountered and the patient tolerated the procedure well.

## 2022-05-17 NOTE — Patient Instructions (Addendum)
If you are age 68 or older, your body mass index should be between 23-30. Your Body mass index is 24.13 kg/m. If this is out of the aforementioned range listed, please consider follow up with your Primary Care Provider.  If you are age 58 or younger, your body mass index should be between 19-25. Your Body mass index is 24.13 kg/m. If this is out of the aformentioned range listed, please consider follow up with your Primary Care Provider.   HEMORRHOID BANDING PROCEDURE    FOLLOW-UP CARE   The procedure you have had should have been relatively painless since the banding of the area involved does not have nerve endings and there is no pain sensation.  The rubber band cuts off the blood supply to the hemorrhoid and the band may fall off as soon as 48 hours after the banding (the band may occasionally be seen in the toilet bowl following a bowel movement). You may notice a temporary feeling of fullness in the rectum which should respond adequately to plain Tylenol or Motrin.  Following the banding, avoid strenuous exercise that evening and resume full activity the next day.  A sitz bath (soaking in a warm tub) or bidet is soothing, and can be useful for cleansing the area after bowel movements.     To avoid constipation, take two tablespoons of natural wheat bran, natural oat bran, flax, Benefiber or any over the counter fiber supplement and increase your water intake to 7-8 glasses daily.    Unless you have been prescribed anorectal medication, do not put anything inside your rectum for two weeks: No suppositories, enemas, fingers, etc.  Occasionally, you may have more bleeding than usual after the banding procedure.  This is often from the untreated hemorrhoids rather than the treated one.  Don't be concerned if there is a tablespoon or so of blood.  If there is more blood than this, lie flat with your bottom higher than your head and apply an ice pack to the area. If the bleeding does not stop  within a half an hour or if you feel faint, call our office at [phone] or go to the emergency room.  Problems are not common; however, if there is a substantial amount of bleeding, severe pain, chills, fever or difficulty passing urine (very rare) or other problems, you should call us at [phone] or report to the nearest emergency room.  Do not stay seated continuously for more than 2-3 hours for a day or two after the procedure.  Tighten your buttock muscles 10-15 times every two hours and take 10-15 deep breaths every 1-2 hours.  Do not spend more than a few minutes on the toilet if you cannot empty your bowel; instead re-visit the toilet at a later time.     The [facility] providers would like to encourage you to use [facility] to communicate with providers for non-urgent requests or questions.  Due to long hold times on the telephone, sending your provider a message by [facility] may be a faster and more efficient way to get a response.  Please allow 48 business hours for a response.  Please remember that this is for non-urgent requests.   It was a pleasure to see you today!  Thank you for trusting me with your gastrointestinal care!    Scott E.Tomasa Rand, MD

## 2022-06-13 ENCOUNTER — Ambulatory Visit (INDEPENDENT_AMBULATORY_CARE_PROVIDER_SITE_OTHER): Payer: Medicare Other | Admitting: Gastroenterology

## 2022-06-13 VITALS — BP 110/60 | HR 97 | Ht 71.0 in | Wt 178.2 lb

## 2022-06-13 DIAGNOSIS — K641 Second degree hemorrhoids: Secondary | ICD-10-CM | POA: Diagnosis not present

## 2023-05-24 ENCOUNTER — Ambulatory Visit: Payer: Medicare HMO | Admitting: Urology

## 2023-05-24 ENCOUNTER — Encounter: Payer: Self-pay | Admitting: Urology

## 2023-05-24 VITALS — BP 110/70 | HR 91 | Ht 71.0 in | Wt 174.0 lb

## 2023-05-24 DIAGNOSIS — R972 Elevated prostate specific antigen [PSA]: Secondary | ICD-10-CM

## 2023-05-24 DIAGNOSIS — N401 Enlarged prostate with lower urinary tract symptoms: Secondary | ICD-10-CM

## 2023-05-24 LAB — BLADDER SCAN AMB NON-IMAGING

## 2023-05-24 NOTE — Patient Instructions (Signed)

## 2023-05-24 NOTE — Progress Notes (Signed)
   05/24/23 2:11 PM   Leroy Matthews [birth year] [id number]  CC: Elevated PSA  HPI: Healthy 67 year old male referred for mildly elevated PSA of 4.7 from March 2024.  No prior PSA values to review.  He really denies any significant urinary symptoms, no nocturia, IPSS score today is 4, with quality-of-life mostly satisfied, PVR is normal at 4ml.  No definite family history of prostate cancer.  He denies any dysuria or gross hematuria.  His wife passed away about a year ago.  Prior bilateral hernia repairs as a child.  Urinalysis today is completely benign.   PMH: Past Medical History:  Diagnosis Date   Complex third degree hemorrhoid 10/09/2017   Inflamed internal hemorrhoid     Surgical History: Past Surgical History:  Procedure Laterality Date   COLONOSCOPY     INGUINAL HERNIA REPAIR Bilateral    age 58   parotid gland removal  2008   TONSILLECTOMY     age 87 or 5   Family History: Family History  Problem Relation Age of Onset   Alzheimer's disease Mother    Atrial fibrillation Mother    Heart disease Father    Colon cancer Neg Hx    Esophageal cancer Neg Hx    Rectal cancer Neg Hx     Social History:  reports that he has quit smoking. His smoking use included cigarettes. He has never used smokeless tobacco. He reports that he does not drink alcohol and does not use drugs.  Physical Exam: BP 110/70 (BP Location: Left Arm, Patient Position: Sitting, Cuff Size: Normal)   Pulse 91   Ht 5\' 11"  (1.803 m)   Wt 174 lb (78.9 kg)   BMI 24.27 kg/m    Constitutional:  Alert and oriented, No acute distress. Cardiovascular: No clubbing, cyanosis, or edema. Respiratory: Normal respiratory effort, no increased work of breathing. GI: Abdomen is soft, nontender, nondistended, no abdominal masses   Laboratory Data: PSA 4.7  Assessment & Plan:   67 year old male with single elevated PSA of 4.7, no significant urinary symptoms, otherwise healthy.  We reviewed the  implications of an elevated PSA and the uncertainty surrounding it. In general, a man's PSA increases with age and is produced by both normal and cancerous prostate tissue. The differential diagnosis for elevated PSA includes BPH, prostate cancer, infection, recent intercourse/ejaculation, recent urethroscopic manipulation (foley placement/cystoscopy) or trauma, and prostatitis.   Management of an elevated PSA can include observation or prostate biopsy and we discussed this in detail. Our goal is to detect clinically significant prostate cancers, and manage with either active surveillance, surgery, or radiation for localized disease. Risks of prostate biopsy include bleeding, infection (including life threatening sepsis), pain, and lower urinary symptoms. Hematuria, hematospermia, and blood in the stool are all common after biopsy and can persist up to 4 weeks.   Repeat PSA reflex to free today, call with results.  If remains elevated or borderline he would like to pursue prostate MRI   Legrand Rams, MD 05/24/2023  [facility] [address], [address] [place], [place] [address] [phone]

## 2023-05-25 LAB — URINALYSIS, COMPLETE
Bilirubin, UA: NEGATIVE
Glucose, UA: NEGATIVE
Ketones, UA: NEGATIVE
Leukocytes,UA: NEGATIVE
Nitrite, UA: NEGATIVE
Protein,UA: NEGATIVE
RBC, UA: NEGATIVE
Specific Gravity, UA: >1.03 — ABNORMAL HIGH (ref 1.005–1.030)
Urobilinogen, Ur: 0.2 mg/dL (ref 0.2–1.0)
pH, UA: 6 (ref 5.0–7.5)

## 2023-05-25 LAB — MICROSCOPIC EXAMINATION: Bacteria, UA: NONE SEEN

## 2023-05-26 LAB — PSA TOTAL (REFLEX TO FREE): Prostate Specific Ag, Serum: 3.4 ng/mL (ref 0.0–4.0)

## 2024-07-08 ENCOUNTER — Institutional Professional Consult (permissible substitution): Payer: Self-pay | Admitting: Cardiovascular Disease

## 2024-07-09 ENCOUNTER — Ambulatory Visit (INDEPENDENT_AMBULATORY_CARE_PROVIDER_SITE_OTHER): Payer: Self-pay | Admitting: Cardiovascular Disease

## 2024-07-09 ENCOUNTER — Encounter: Payer: Self-pay | Admitting: Cardiovascular Disease

## 2024-07-09 VITALS — BP 124/82 | HR 72 | Ht 70.0 in | Wt 176.0 lb

## 2024-07-09 DIAGNOSIS — F17209 Nicotine dependence, unspecified, with unspecified nicotine-induced disorders: Secondary | ICD-10-CM

## 2024-07-09 DIAGNOSIS — I2510 Atherosclerotic heart disease of native coronary artery without angina pectoris: Secondary | ICD-10-CM | POA: Diagnosis not present

## 2024-07-09 DIAGNOSIS — Z0130 Encounter for examination of blood pressure without abnormal findings: Secondary | ICD-10-CM

## 2024-07-09 DIAGNOSIS — I771 Stricture of artery: Secondary | ICD-10-CM | POA: Diagnosis not present

## 2024-07-09 DIAGNOSIS — R9431 Abnormal electrocardiogram [ECG] [EKG]: Secondary | ICD-10-CM | POA: Diagnosis not present

## 2024-07-09 DIAGNOSIS — E782 Mixed hyperlipidemia: Secondary | ICD-10-CM | POA: Diagnosis not present

## 2024-07-09 NOTE — Progress Notes (Addendum)
 Cardiology Office Note   Date:  07/09/2024   ID:  Leroy Matthews, DOB 01/10/1956, MRN [id number]  PCP:  Patient, No Pcp Per  Cardiologist:  Denyse Bathe, MD      History of Present Illness: Leroy Matthews is a 68 y.o. male who presents for  Chief Complaint  Patient presents with   Acute Visit    67YO WM presents from Dr. Trenna office as CXR showed tortuous aort and coronary calcifications. He denises chest pain or SOB.      Past Medical History:  Diagnosis Date   Complex third degree hemorrhoid 10/09/2017   Inflamed internal hemorrhoid      Past Surgical History:  Procedure Laterality Date   COLONOSCOPY     INGUINAL HERNIA REPAIR Bilateral    age 64   parotid gland removal  2008   TONSILLECTOMY     age 36 or 5     Current Outpatient Medications  Medication Sig Dispense Refill   Ascorbic Acid (VITAMIN C PO) Take 1 tablet daily by mouth.     CALCIUM-VITAMIN D PO Take 1 tablet daily by mouth.     Cholecalciferol (VITAMIN D3 PO) Take 1 capsule daily by mouth.     Multiple Vitamin (MULTIVITAMIN) tablet Take by mouth.     VITAMIN E PO Take 1 capsule daily by mouth.     Current Facility-Administered Medications  Medication Dose Route Frequency Provider Last Rate Last Admin   0.9 %  sodium chloride  infusion  500 mL Intravenous Once Cunningham, Scott E, MD        Allergies:   Penicillins and Sulfa antibiotics    Social History:   reports that he has quit smoking. His smoking use included cigarettes. He has never used smokeless tobacco. He reports that he does not drink alcohol and does not use drugs.   Family History:  family history includes Alzheimer's disease in his mother; Atrial fibrillation in his mother; Heart disease in his father.    ROS:     Review of Systems  Constitutional: Negative.   HENT: Negative.    Eyes: Negative.   Respiratory: Negative.    Gastrointestinal: Negative.   Genitourinary: Negative.   Musculoskeletal: Negative.    Skin: Negative.   Neurological: Negative.   Endo/Heme/Allergies: Negative.   Psychiatric/Behavioral: Negative.    All other systems reviewed and are negative.     All other systems are reviewed and negative.    PHYSICAL EXAM: VS:  BP 124/82   Pulse 72   Ht 5' 10 (1.778 m)   Wt 176 lb (79.8 kg)   SpO2 97%   BMI 25.25 kg/m  , BMI Body mass index is 25.25 kg/m. Last weight:  Wt Readings from Last 3 Encounters:  07/09/24 176 lb (79.8 kg)  05/24/23 174 lb (78.9 kg)  06/13/22 178 lb 3.2 oz (80.8 kg)     Physical Exam Vitals reviewed.  Constitutional:      Appearance: Normal appearance. He is normal weight.  HENT:     Head: Normocephalic.     Nose: Nose normal.     Mouth/Throat:     Mouth: Mucous membranes are moist.  Eyes:     Pupils: Pupils are equal, round, and reactive to light.  Cardiovascular:     Rate and Rhythm: Normal rate and regular rhythm.     Pulses: Normal pulses.     Heart sounds: Normal heart sounds.  Pulmonary:     Effort: Pulmonary effort is  normal.  Abdominal:     General: Abdomen is flat. Bowel sounds are normal.  Musculoskeletal:        General: Normal range of motion.     Cervical back: Normal range of motion.  Skin:    General: Skin is warm.  Neurological:     General: No focal deficit present.     Mental Status: He is alert.  Psychiatric:        Mood and Affect: Mood normal.      NSR 65/min LVH EKG:   Recent Labs: No results found for requested labs within last 365 days.    Lipid Panel No results found for: CHOL, TRIG, HDL, CHOLHDL, VLDL, LDLCALC, LDLDIRECT    Other studies Reviewed: Additional studies/ records that were reviewed today include:  Review of the above records demonstrates:       No data to display            ASSESSMENT AND PLAN:    ICD-10-CM   1. Abnormal EKG  R94.31 PCV ECHOCARDIOGRAM COMPLETE    MYOCARDIAL PERFUSION IMAGING    EKG 12-Lead   Patient has calcification of aorta and  coronaries on CXR and was sent here for evaluation. Advise echo and stress test.    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 Coronary artery calcification  I25.10 PCV ECHOCARDIOGRAM COMPLETE    MYOCARDIAL PERFUSION IMAGING    EKG 12-Lead    4. Mixed hyperlipidemia  E78.2 PCV ECHOCARDIOGRAM COMPLETE    MYOCARDIAL PERFUSION IMAGING    EKG 12-Lead       Problem List Items Addressed This Visit   None Visit Diagnoses       Abnormal EKG    -  Primary   Patient has calcification of aorta and coronaries on CXR and was sent here for evaluation. Advise echo and stress test.   Relevant Orders   PCV ECHOCARDIOGRAM COMPLETE   MYOCARDIAL PERFUSION IMAGING   EKG 12-Lead     Tortuous aorta (HCC)       Relevant Orders   PCV ECHOCARDIOGRAM COMPLETE   MYOCARDIAL PERFUSION IMAGING   EKG 12-Lead     Coronary artery calcification       Relevant Orders   PCV ECHOCARDIOGRAM COMPLETE   MYOCARDIAL PERFUSION IMAGING   EKG 12-Lead     Mixed hyperlipidemia       Relevant Orders   PCV ECHOCARDIOGRAM COMPLETE   MYOCARDIAL PERFUSION IMAGING   EKG 12-Lead          Disposition:   Return in about 4 weeks (around 08/06/2024) for echo, stress  test and f/u.    Total time spent: 50 minutes  Signed,  Denyse Bathe, MD  07/09/2024 11:09 AM    [facility]

## 2024-07-10 ENCOUNTER — Encounter: Payer: Self-pay | Admitting: Cardiovascular Disease

## 2024-07-25 ENCOUNTER — Ambulatory Visit

## 2024-07-25 DIAGNOSIS — I771 Stricture of artery: Secondary | ICD-10-CM

## 2024-07-25 DIAGNOSIS — R9431 Abnormal electrocardiogram [ECG] [EKG]: Secondary | ICD-10-CM | POA: Diagnosis not present

## 2024-07-25 DIAGNOSIS — E782 Mixed hyperlipidemia: Secondary | ICD-10-CM

## 2024-07-25 DIAGNOSIS — I2510 Atherosclerotic heart disease of native coronary artery without angina pectoris: Secondary | ICD-10-CM | POA: Diagnosis not present

## 2024-08-09 ENCOUNTER — Ambulatory Visit

## 2024-08-09 DIAGNOSIS — I361 Nonrheumatic tricuspid (valve) insufficiency: Secondary | ICD-10-CM | POA: Diagnosis not present

## 2024-08-09 DIAGNOSIS — I340 Nonrheumatic mitral (valve) insufficiency: Secondary | ICD-10-CM

## 2024-08-09 DIAGNOSIS — E782 Mixed hyperlipidemia: Secondary | ICD-10-CM

## 2024-08-09 DIAGNOSIS — R9431 Abnormal electrocardiogram [ECG] [EKG]: Secondary | ICD-10-CM

## 2024-08-09 DIAGNOSIS — I351 Nonrheumatic aortic (valve) insufficiency: Secondary | ICD-10-CM | POA: Diagnosis not present

## 2024-08-09 DIAGNOSIS — I371 Nonrheumatic pulmonary valve insufficiency: Secondary | ICD-10-CM

## 2024-08-09 DIAGNOSIS — I771 Stricture of artery: Secondary | ICD-10-CM

## 2024-08-09 DIAGNOSIS — I2510 Atherosclerotic heart disease of native coronary artery without angina pectoris: Secondary | ICD-10-CM

## 2024-08-15 ENCOUNTER — Encounter: Payer: Self-pay | Admitting: Cardiovascular Disease

## 2024-08-15 ENCOUNTER — Ambulatory Visit: Admitting: Cardiovascular Disease

## 2024-08-15 VITALS — BP 102/70 | HR 82 | Ht 70.0 in | Wt 165.8 lb

## 2024-08-15 DIAGNOSIS — R9431 Abnormal electrocardiogram [ECG] [EKG]: Secondary | ICD-10-CM

## 2024-08-15 DIAGNOSIS — I2510 Atherosclerotic heart disease of native coronary artery without angina pectoris: Secondary | ICD-10-CM | POA: Diagnosis not present

## 2024-08-15 DIAGNOSIS — E782 Mixed hyperlipidemia: Secondary | ICD-10-CM

## 2024-08-15 DIAGNOSIS — Z0130 Encounter for examination of blood pressure without abnormal findings: Secondary | ICD-10-CM

## 2024-08-15 DIAGNOSIS — I771 Stricture of artery: Secondary | ICD-10-CM | POA: Diagnosis not present

## 2024-08-15 MED ORDER — ROSUVASTATIN CALCIUM 10 MG PO TABS
10.0000 mg | ORAL_TABLET | Freq: Every day | ORAL | 11 refills | Status: AC
Start: 2024-08-15 — End: 2025-08-15

## 2024-08-15 NOTE — Progress Notes (Signed)
 Cardiology Office Note   Date:  08/15/2024   ID:  CINCH ORMOND, DOB June 26, 1956, MRN [id number]  PCP:  Patient, No Pcp Per  Cardiologist:  Denyse Bathe, MD      History of Present Illness: Leroy Matthews is a 68 y.o. male who presents for  Chief Complaint  Patient presents with   Results    4 week nst/echo results     Has head cold after stress test , and is coughing.      Past Medical History:  Diagnosis Date   Complex third degree hemorrhoid 10/09/2017   Inflamed internal hemorrhoid      Past Surgical History:  Procedure Laterality Date   COLONOSCOPY     INGUINAL HERNIA REPAIR Bilateral    age 78   parotid gland removal  2008   TONSILLECTOMY     age 51 or 5     Current Outpatient Medications  Medication Sig Dispense Refill   Ascorbic Acid (VITAMIN C PO) Take 1 tablet daily by mouth.     CALCIUM -VITAMIN D PO Take 1 tablet daily by mouth.     Cholecalciferol (VITAMIN D3 PO) Take 1 capsule daily by mouth.     Multiple Vitamin (MULTIVITAMIN) tablet Take by mouth.     rosuvastatin  (CRESTOR ) 10 MG tablet Take 1 tablet (10 mg total) by mouth daily. 30 tablet 11   Current Facility-Administered Medications  Medication Dose Route Frequency Provider Last Rate Last Admin   0.9 %  sodium chloride  infusion  500 mL Intravenous Once Cunningham, Scott E, MD        Allergies:   Penicillins and Sulfa antibiotics    Social History:   reports that he has quit smoking. His smoking use included cigarettes. He has never used smokeless tobacco. He reports that he does not drink alcohol and does not use drugs.   Family History:  family history includes Alzheimer's disease in his mother; Atrial fibrillation in his mother; Heart disease in his father.    ROS:     Review of Systems  Constitutional: Negative.   HENT: Negative.    Eyes: Negative.   Respiratory: Negative.    Gastrointestinal: Negative.   Genitourinary: Negative.   Musculoskeletal: Negative.   Skin:  Negative.   Neurological: Negative.   Endo/Heme/Allergies: Negative.   Psychiatric/Behavioral: Negative.    All other systems reviewed and are negative.     All other systems are reviewed and negative.    PHYSICAL EXAM: VS:  BP 102/70   Pulse 82   Ht 5' 10 (1.778 m)   Wt 165 lb 12.8 oz (75.2 kg)   SpO2 98%   BMI 23.79 kg/m  , BMI Body mass index is 23.79 kg/m. Last weight:  Wt Readings from Last 3 Encounters:  08/15/24 165 lb 12.8 oz (75.2 kg)  07/09/24 176 lb (79.8 kg)  05/24/23 174 lb (78.9 kg)     Physical Exam Vitals reviewed.  Constitutional:      Appearance: Normal appearance. He is normal weight.  HENT:     Head: Normocephalic.     Nose: Nose normal.     Mouth/Throat:     Mouth: Mucous membranes are moist.  Eyes:     Pupils: Pupils are equal, round, and reactive to light.  Cardiovascular:     Rate and Rhythm: Normal rate and regular rhythm.     Pulses: Normal pulses.     Heart sounds: Normal heart sounds.  Pulmonary:  Effort: Pulmonary effort is normal.  Abdominal:     General: Abdomen is flat. Bowel sounds are normal.  Musculoskeletal:        General: Normal range of motion.     Cervical back: Normal range of motion.  Skin:    General: Skin is warm.  Neurological:     General: No focal deficit present.     Mental Status: He is alert.  Psychiatric:        Mood and Affect: Mood normal.       EKG:   Recent Labs: No results found for requested labs within last 365 days.    Lipid Panel No results found for: CHOL, TRIG, HDL, CHOLHDL, VLDL, LDLCALC, LDLDIRECT    Other studies Reviewed: Additional studies/ records that were reviewed today include:  Review of the above records demonstrates:       No data to display            ASSESSMENT AND PLAN:    ICD-10-CM   1. Coronary artery calcification  I25.10 rosuvastatin  (CRESTOR ) 10 MG tablet   Stress test no ischaemia, normal LVEF. Advise high intensity statins, but  start crestor  10 mg right now. Has congestions. May consider farxiga.    2. Tortuous aorta (HCC)  I77.1 rosuvastatin  (CRESTOR ) 10 MG tablet    3. Mixed hyperlipidemia  E78.2 rosuvastatin  (CRESTOR ) 10 MG tablet    4. Abnormal EKG  R94.31 rosuvastatin  (CRESTOR ) 10 MG tablet       Problem List Items Addressed This Visit   None Visit Diagnoses       Coronary artery calcification    -  Primary   Stress test no ischaemia, normal LVEF. Advise high intensity statins, but start crestor  10 mg right now. Has congestions. May consider farxiga.   Relevant Medications   rosuvastatin  (CRESTOR ) 10 MG tablet     Tortuous aorta (HCC)       Relevant Medications   rosuvastatin  (CRESTOR ) 10 MG tablet     Mixed hyperlipidemia       Relevant Medications   rosuvastatin  (CRESTOR ) 10 MG tablet     Abnormal EKG       Relevant Medications   rosuvastatin  (CRESTOR ) 10 MG tablet          Disposition:   Return in about 3 months (around 11/15/2024).    Total time spent: 40 minutes  Signed,  Denyse Bathe, MD  08/15/2024 10:55 AM    [facility]

## 2024-10-14 LAB — COLOGUARD: COLOGUARD: NEGATIVE

## 2024-10-16 ENCOUNTER — Encounter: Payer: Self-pay | Admitting: Cardiovascular Disease

## 2024-11-21 ENCOUNTER — Ambulatory Visit: Admitting: Cardiovascular Disease

## 2024-11-21 ENCOUNTER — Encounter: Payer: Self-pay | Admitting: Cardiovascular Disease

## 2024-11-21 VITALS — BP 117/76 | HR 62 | Ht 70.0 in | Wt 172.4 lb

## 2024-11-21 DIAGNOSIS — I351 Nonrheumatic aortic (valve) insufficiency: Secondary | ICD-10-CM

## 2024-11-21 DIAGNOSIS — I2510 Atherosclerotic heart disease of native coronary artery without angina pectoris: Secondary | ICD-10-CM | POA: Diagnosis not present

## 2024-11-21 DIAGNOSIS — Z0130 Encounter for examination of blood pressure without abnormal findings: Secondary | ICD-10-CM

## 2024-11-21 DIAGNOSIS — G4733 Obstructive sleep apnea (adult) (pediatric): Secondary | ICD-10-CM

## 2024-11-21 DIAGNOSIS — I340 Nonrheumatic mitral (valve) insufficiency: Secondary | ICD-10-CM | POA: Diagnosis not present

## 2024-11-21 DIAGNOSIS — E782 Mixed hyperlipidemia: Secondary | ICD-10-CM | POA: Diagnosis not present

## 2024-11-21 DIAGNOSIS — I771 Stricture of artery: Secondary | ICD-10-CM | POA: Diagnosis not present

## 2024-11-21 DIAGNOSIS — I5189 Other ill-defined heart diseases: Secondary | ICD-10-CM

## 2024-11-21 MED ORDER — DAPAGLIFLOZIN PROPANEDIOL 10 MG PO TABS: 10.0000 mg | ORAL_TABLET | Freq: Every day | ORAL | 3 refills | Status: AC

## 2024-11-21 NOTE — Progress Notes (Signed)
 Cardiology Office Note   Date:  11/21/2024   ID:  Leroy Matthews, DOB [birth year], MRN [id number]  PCP:  Patient, No Pcp Per  Cardiologist:  Denyse Bathe, MD      History of Present Illness: Leroy Matthews is a 68 y.o. male who presents for  Chief Complaint  Patient presents with   Follow-up    3 month follow up. Muscle pain in arms.     Has pain in arms from statins, and taking COQ10.      Past Medical History:  Diagnosis Date   Complex third degree hemorrhoid 10/09/2017   Inflamed internal hemorrhoid      Past Surgical History:  Procedure Laterality Date   COLONOSCOPY     INGUINAL HERNIA REPAIR Bilateral    age 36   parotid gland removal  2008   TONSILLECTOMY     age 45 or 5     Current Outpatient Medications  Medication Sig Dispense Refill   Ascorbic Acid (VITAMIN C PO) Take 1 tablet daily by mouth.     CALCIUM -VITAMIN D PO Take 1 tablet daily by mouth.     Cholecalciferol (VITAMIN D3 PO) Take 1 capsule daily by mouth.     dapagliflozin propanediol (FARXIGA) 10 MG TABS tablet Take 1 tablet (10 mg total) by mouth daily before breakfast. 30 tablet 3   Multiple Vitamin (MULTIVITAMIN) tablet Take by mouth.     rosuvastatin  (CRESTOR ) 10 MG tablet Take 1 tablet (10 mg total) by mouth daily. 30 tablet 11   vitamin B-12 (CYANOCOBALAMIN) 100 MCG tablet Take 100 mcg by mouth daily.     Current Facility-Administered Medications  Medication Dose Route Frequency Provider Last Rate Last Admin   0.9 %  sodium chloride  infusion  500 mL Intravenous Once Cunningham, Scott E, MD        Allergies:   Penicillins and Sulfa antibiotics    Social History:   reports that he has quit smoking. His smoking use included cigarettes. He has never used smokeless tobacco. He reports that he does not drink alcohol and does not use drugs.   Family History:  family history includes Alzheimer's disease in his mother; Atrial fibrillation in his mother; Heart disease in his father.     ROS:     Review of Systems  Constitutional: Negative.   HENT: Negative.    Eyes: Negative.   Respiratory: Negative.    Gastrointestinal: Negative.   Genitourinary: Negative.   Musculoskeletal: Negative.   Skin: Negative.   Neurological: Negative.   Endo/Heme/Allergies: Negative.   Psychiatric/Behavioral: Negative.    All other systems reviewed and are negative.     All other systems are reviewed and negative.    PHYSICAL EXAM: VS:  BP 117/76   Pulse 62   Ht 5' 10 (1.778 m)   Wt 172 lb 6.4 oz (78.2 kg)   SpO2 96%   BMI 24.74 kg/m  , BMI Body mass index is 24.74 kg/m. Last weight:  Wt Readings from Last 3 Encounters:  11/21/24 172 lb 6.4 oz (78.2 kg)  08/15/24 165 lb 12.8 oz (75.2 kg)  07/09/24 176 lb (79.8 kg)     Physical Exam Vitals reviewed.  Constitutional:      Appearance: Normal appearance. He is normal weight.  HENT:     Head: Normocephalic.     Nose: Nose normal.     Mouth/Throat:     Mouth: Mucous membranes are moist.  Eyes:     Pupils:  Pupils are equal, round, and reactive to light.  Cardiovascular:     Rate and Rhythm: Normal rate and regular rhythm.     Pulses: Normal pulses.     Heart sounds: Normal heart sounds.  Pulmonary:     Effort: Pulmonary effort is normal.  Abdominal:     General: Abdomen is flat. Bowel sounds are normal.  Musculoskeletal:        General: Normal range of motion.     Cervical back: Normal range of motion.  Skin:    General: Skin is warm.  Neurological:     General: No focal deficit present.     Mental Status: He is alert.  Psychiatric:        Mood and Affect: Mood normal.       EKG:   Recent Labs: No results found for requested labs within last 365 days.    Lipid Panel No results found for: CHOL, TRIG, HDL, CHOLHDL, VLDL, LDLCALC, LDLDIRECT    Other studies Reviewed: Additional studies/ records that were reviewed today include:  Review of the above records demonstrates:        No data to display            ASSESSMENT AND PLAN:    ICD-10-CM   1. Tortuous aorta  I77.1 dapagliflozin propanediol (FARXIGA) 10 MG TABS tablet    2. Coronary artery calcification  I25.10 dapagliflozin propanediol (FARXIGA) 10 MG TABS tablet    3. Mixed hyperlipidemia  E78.2 dapagliflozin propanediol (FARXIGA) 10 MG TABS tablet    4. Nonrheumatic mitral valve regurgitation  I34.0 dapagliflozin propanediol (FARXIGA) 10 MG TABS tablet   Mild MR, normal EF.    5. Diastolic dysfunction without heart failure  I51.89 dapagliflozin propanediol (FARXIGA) 10 MG TABS tablet   add farxiga    6. Nonrheumatic aortic valve insufficiency  I35.1    mild AR/Mr    7. Obstructive sleep apnea syndrome  G47.33        Problem List Items Addressed This Visit   None Visit Diagnoses       Tortuous aorta    -  Primary   Relevant Medications   dapagliflozin propanediol (FARXIGA) 10 MG TABS tablet     Coronary artery calcification       Relevant Medications   dapagliflozin propanediol (FARXIGA) 10 MG TABS tablet     Mixed hyperlipidemia       Relevant Medications   dapagliflozin propanediol (FARXIGA) 10 MG TABS tablet     Nonrheumatic mitral valve regurgitation       Mild MR, normal EF.   Relevant Medications   dapagliflozin propanediol (FARXIGA) 10 MG TABS tablet     Diastolic dysfunction without heart failure       add farxiga   Relevant Medications   dapagliflozin propanediol (FARXIGA) 10 MG TABS tablet     Nonrheumatic aortic valve insufficiency       mild AR/Mr     Obstructive sleep apnea syndrome              Disposition:   Return in about 3 months (around 02/19/2025).    Total time spent: 35 minutes  Signed,  Denyse Bathe, MD  11/21/2024 10:55 AM    [facility]

## 2025-02-20 ENCOUNTER — Ambulatory Visit: Admitting: Cardiovascular Disease
